# Patient Record
Sex: Male | Born: 1960 | Race: White | Hispanic: No | Marital: Married | Smoking: Never smoker
Health system: Southern US, Community
[De-identification: ages and names within clinical notes are randomized; demographics above are authoritative.]

## PROBLEM LIST (undated history)

## (undated) DIAGNOSIS — M109 Gout, unspecified: Secondary | ICD-10-CM

## (undated) DIAGNOSIS — I10 Essential (primary) hypertension: Secondary | ICD-10-CM

## (undated) DIAGNOSIS — N200 Calculus of kidney: Secondary | ICD-10-CM

## (undated) DIAGNOSIS — R000 Tachycardia, unspecified: Secondary | ICD-10-CM

## (undated) DIAGNOSIS — K635 Polyp of colon: Secondary | ICD-10-CM

## (undated) DIAGNOSIS — K7581 Nonalcoholic steatohepatitis (NASH): Secondary | ICD-10-CM

## (undated) HISTORY — PX: VASECTOMY: SHX75

## (undated) HISTORY — DX: Essential (primary) hypertension: I10

## (undated) HISTORY — DX: Polyp of colon: K63.5

## (undated) HISTORY — PX: CHOLECYSTECTOMY: SHX55

## (undated) HISTORY — DX: Nonalcoholic steatohepatitis (NASH): K75.81

## (undated) HISTORY — DX: Tachycardia, unspecified: R00.0

## (undated) HISTORY — PX: WISDOM TOOTH EXTRACTION: SHX21

## (undated) HISTORY — DX: Gout, unspecified: M10.9

## (undated) HISTORY — DX: Calculus of kidney: N20.0

## (undated) HISTORY — PX: REFRACTIVE SURGERY: SHX103

---

## 2012-09-04 DIAGNOSIS — Z87442 Personal history of urinary calculi: Secondary | ICD-10-CM | POA: Insufficient documentation

## 2012-09-04 DIAGNOSIS — K5730 Diverticulosis of large intestine without perforation or abscess without bleeding: Secondary | ICD-10-CM | POA: Insufficient documentation

## 2012-09-04 DIAGNOSIS — L719 Rosacea, unspecified: Secondary | ICD-10-CM | POA: Insufficient documentation

## 2012-09-04 DIAGNOSIS — I10 Essential (primary) hypertension: Secondary | ICD-10-CM | POA: Insufficient documentation

## 2012-09-04 DIAGNOSIS — Z8679 Personal history of other diseases of the circulatory system: Secondary | ICD-10-CM | POA: Insufficient documentation

## 2012-09-04 DIAGNOSIS — M109 Gout, unspecified: Secondary | ICD-10-CM | POA: Insufficient documentation

## 2014-06-22 DIAGNOSIS — N529 Male erectile dysfunction, unspecified: Secondary | ICD-10-CM | POA: Insufficient documentation

## 2015-01-28 DIAGNOSIS — K635 Polyp of colon: Secondary | ICD-10-CM | POA: Insufficient documentation

## 2015-09-05 DIAGNOSIS — R7989 Other specified abnormal findings of blood chemistry: Secondary | ICD-10-CM | POA: Insufficient documentation

## 2015-09-05 DIAGNOSIS — R945 Abnormal results of liver function studies: Secondary | ICD-10-CM

## 2015-11-18 DIAGNOSIS — I10 Essential (primary) hypertension: Secondary | ICD-10-CM | POA: Diagnosis not present

## 2015-11-18 DIAGNOSIS — K219 Gastro-esophageal reflux disease without esophagitis: Secondary | ICD-10-CM | POA: Diagnosis not present

## 2015-11-18 DIAGNOSIS — K8020 Calculus of gallbladder without cholecystitis without obstruction: Secondary | ICD-10-CM | POA: Diagnosis not present

## 2015-11-18 DIAGNOSIS — K824 Cholesterolosis of gallbladder: Secondary | ICD-10-CM | POA: Diagnosis not present

## 2015-11-18 DIAGNOSIS — K8010 Calculus of gallbladder with chronic cholecystitis without obstruction: Secondary | ICD-10-CM | POA: Diagnosis not present

## 2015-11-18 DIAGNOSIS — Z9889 Other specified postprocedural states: Secondary | ICD-10-CM | POA: Diagnosis not present

## 2015-11-18 DIAGNOSIS — Z7982 Long term (current) use of aspirin: Secondary | ICD-10-CM | POA: Diagnosis not present

## 2015-11-18 DIAGNOSIS — M109 Gout, unspecified: Secondary | ICD-10-CM | POA: Diagnosis not present

## 2015-11-18 DIAGNOSIS — K8080 Other cholelithiasis without obstruction: Secondary | ICD-10-CM | POA: Diagnosis not present

## 2015-12-08 DIAGNOSIS — Z9049 Acquired absence of other specified parts of digestive tract: Secondary | ICD-10-CM | POA: Insufficient documentation

## 2015-12-08 MED FILL — LOSARTAN POTASSIUM 100 MG T: 100 | 90 days supply | Qty: 90 | Fill #1

## 2015-12-08 MED FILL — MINOCYCLINE 100 MG CAPSULE: 100 | 30 days supply | Qty: 60 | Fill #1

## 2015-12-08 MED FILL — BYSTOLIC 20 MG TABLET: 20 | 90 days supply | Qty: 90 | Fill #1

## 2015-12-08 MED FILL — ALLOPURINOL 300 MG TABLET: 300 | 90 days supply | Qty: 90 | Fill #1

## 2015-12-13 DIAGNOSIS — I10 Essential (primary) hypertension: Secondary | ICD-10-CM | POA: Diagnosis not present

## 2015-12-13 DIAGNOSIS — Z9049 Acquired absence of other specified parts of digestive tract: Secondary | ICD-10-CM | POA: Diagnosis not present

## 2015-12-13 DIAGNOSIS — R7989 Other specified abnormal findings of blood chemistry: Secondary | ICD-10-CM | POA: Diagnosis not present

## 2015-12-13 DIAGNOSIS — K824 Cholesterolosis of gallbladder: Secondary | ICD-10-CM | POA: Diagnosis not present

## 2015-12-13 MED FILL — AMLODIPINE BESYLATE 5 MG TA: 5 | 90 days supply | Qty: 90 | Fill #0

## 2016-01-09 MED FILL — MINOCYCLINE 100 MG CAPSULE: 100 | 30 days supply | Qty: 60 | Fill #2

## 2016-02-10 MED FILL — MINOCYCLINE HCL 50 MG TAB: 50 | 30 days supply | Qty: 120 | Fill #0

## 2016-03-07 MED FILL — AMLODIPINE BESYLATE 5 MG TA: 5 | 90 days supply | Qty: 90 | Fill #1

## 2016-03-30 MED FILL — ALLOPURINOL 300 MG TABLET: 300 | 90 days supply | Qty: 90 | Fill #2

## 2016-04-06 DIAGNOSIS — R05 Cough: Secondary | ICD-10-CM | POA: Diagnosis not present

## 2016-04-06 DIAGNOSIS — R0982 Postnasal drip: Secondary | ICD-10-CM | POA: Diagnosis not present

## 2016-04-20 MED FILL — MINOCYCLINE 100 MG CAPSULE: 100 | 90 days supply | Qty: 180 | Fill #0

## 2016-04-20 MED FILL — LOSARTAN POTASSIUM 100 MG T: 100 | 90 days supply | Qty: 90 | Fill #2

## 2016-05-07 MED FILL — MONTELUKAST SOD 10 MG TAB: 10 | 30 days supply | Qty: 90 | Fill #0

## 2016-05-07 MED FILL — LEVOCETIRIZINE 5 MG TABLET: 5 | 90 days supply | Qty: 90 | Fill #0

## 2016-06-06 MED FILL — AMLODIPINE BESYLATE 5 MG TA: 5 | 90 days supply | Qty: 90 | Fill #0

## 2016-06-19 MED FILL — ALLOPURINOL 300 MG TABLET: 300 | 90 days supply | Qty: 90 | Fill #3

## 2016-07-23 MED FILL — LOSARTAN POTASSIUM 100 MG T: 100 | 90 days supply | Qty: 90 | Fill #3

## 2016-07-23 MED FILL — MINOCYCLINE 100 MG CAPSULE: 100 | 90 days supply | Qty: 180 | Fill #1

## 2016-08-02 MED FILL — MONTELUKAST SOD 10 MG TAB: 10 | 30 days supply | Qty: 90 | Fill #1

## 2016-08-30 MED FILL — AMLODIPINE BESYLATE 5 MG TA: 5 | 90 days supply | Qty: 90 | Fill #1

## 2016-09-28 MED FILL — ALLOPURINOL 300 MG TABLET: 300 | 90 days supply | Qty: 90 | Fill #0

## 2016-10-15 DIAGNOSIS — R221 Localized swelling, mass and lump, neck: Secondary | ICD-10-CM | POA: Diagnosis not present

## 2016-10-15 DIAGNOSIS — R591 Generalized enlarged lymph nodes: Secondary | ICD-10-CM | POA: Diagnosis not present

## 2016-10-15 MED FILL — LOSARTAN POTASSIUM 100 MG T: 100 | 90 days supply | Qty: 90 | Fill #0

## 2016-10-15 MED FILL — MINOCYCLINE 100 MG CAPSULE: 100 | 90 days supply | Qty: 180 | Fill #2

## 2016-10-16 DIAGNOSIS — R591 Generalized enlarged lymph nodes: Secondary | ICD-10-CM | POA: Diagnosis not present

## 2016-10-16 DIAGNOSIS — R221 Localized swelling, mass and lump, neck: Secondary | ICD-10-CM | POA: Diagnosis not present

## 2016-10-22 DIAGNOSIS — R221 Localized swelling, mass and lump, neck: Secondary | ICD-10-CM | POA: Diagnosis not present

## 2016-10-22 DIAGNOSIS — R591 Generalized enlarged lymph nodes: Secondary | ICD-10-CM | POA: Diagnosis not present

## 2016-10-30 MED FILL — MONTELUKAST SOD 10 MG TAB: 10 | 30 days supply | Qty: 90 | Fill #2

## 2016-11-28 MED FILL — AMLODIPINE BESYLATE 5 MG TA: 5 | 90 days supply | Qty: 90 | Fill #2

## 2016-11-29 MED FILL — BYSTOLIC 20 MG TABLET: 20 | 90 days supply | Qty: 90 | Fill #0

## 2017-01-01 MED FILL — ALLOPURINOL 300 MG TABLET: 300 | 90 days supply | Qty: 90 | Fill #1

## 2017-01-17 MED FILL — MINOCYCLINE 100 MG CAPSULE: 100 | 90 days supply | Qty: 180 | Fill #0

## 2017-01-17 MED FILL — LOSARTAN POTASSIUM 100 MG T: 100 | 90 days supply | Qty: 90 | Fill #1

## 2017-01-30 MED FILL — MONTELUKAST SOD 10 MG TAB: 10 | 90 days supply | Qty: 90 | Fill #3

## 2017-03-05 MED FILL — AMLODIPINE BESYLATE 5 MG TA: 5 | 90 days supply | Qty: 90 | Fill #3

## 2017-04-03 MED FILL — ALLOPURINOL 300 MG TABLET: 300 | 90 days supply | Qty: 90 | Fill #2

## 2017-04-22 MED FILL — MINOCYCLINE 100 MG CAPSULE: 100 | 90 days supply | Qty: 180 | Fill #1

## 2017-04-22 MED FILL — LOSARTAN POTASSIUM 100 MG T: 100 | 90 days supply | Qty: 90 | Fill #2

## 2017-05-27 MED FILL — BYSTOLIC 20 MG TABLET: 20 | 90 days supply | Qty: 90 | Fill #1

## 2017-05-27 MED FILL — AMLODIPINE BESYLATE 5 MG TA: 5 | 90 days supply | Qty: 90 | Fill #4

## 2017-07-01 MED FILL — ALLOPURINOL 300 MG TABLET: 300 | 90 days supply | Qty: 90 | Fill #3

## 2017-07-16 MED FILL — LOSARTAN POTASSIUM 100 MG T: 100 | 90 days supply | Qty: 90 | Fill #3

## 2017-07-16 MED FILL — MINOCYCLINE 100 MG CAPSULE: 100 | 90 days supply | Qty: 180 | Fill #2

## 2017-08-30 DIAGNOSIS — L918 Other hypertrophic disorders of the skin: Secondary | ICD-10-CM | POA: Diagnosis not present

## 2017-08-30 DIAGNOSIS — I10 Essential (primary) hypertension: Secondary | ICD-10-CM | POA: Diagnosis not present

## 2017-08-30 DIAGNOSIS — Z0000 Encounter for general adult medical examination without abnormal findings: Secondary | ICD-10-CM | POA: Diagnosis not present

## 2017-08-30 DIAGNOSIS — L989 Disorder of the skin and subcutaneous tissue, unspecified: Secondary | ICD-10-CM | POA: Diagnosis not present

## 2017-08-30 DIAGNOSIS — Z9189 Other specified personal risk factors, not elsewhere classified: Secondary | ICD-10-CM | POA: Diagnosis not present

## 2017-08-30 DIAGNOSIS — M1A9XX0 Chronic gout, unspecified, without tophus (tophi): Secondary | ICD-10-CM | POA: Diagnosis not present

## 2017-08-30 MED FILL — OLMESARTAN MEDOXOMIL 40 MG: 40 | 30 days supply | Qty: 30 | Fill #0

## 2017-09-02 MED FILL — AMLODIPINE BESYLATE 5 MG TA: 5 | 90 days supply | Qty: 90 | Fill #0

## 2017-10-01 DIAGNOSIS — R945 Abnormal results of liver function studies: Secondary | ICD-10-CM | POA: Diagnosis not present

## 2017-10-01 DIAGNOSIS — I10 Essential (primary) hypertension: Secondary | ICD-10-CM | POA: Diagnosis not present

## 2017-10-01 DIAGNOSIS — R0982 Postnasal drip: Secondary | ICD-10-CM | POA: Diagnosis not present

## 2017-10-01 DIAGNOSIS — Q828 Other specified congenital malformations of skin: Secondary | ICD-10-CM | POA: Diagnosis not present

## 2017-10-01 DIAGNOSIS — R208 Other disturbances of skin sensation: Secondary | ICD-10-CM | POA: Diagnosis not present

## 2017-10-01 MED FILL — ALLOPURINOL 300 MG TABLET: 300 | 90 days supply | Qty: 90 | Fill #0

## 2017-10-01 MED FILL — OLMESARTAN MEDOXOMIL 40 MG: 40 | 90 days supply | Qty: 90 | Fill #0

## 2017-10-01 MED FILL — MONTELUKAST SOD 10 MG TAB: 10 | 90 days supply | Qty: 90 | Fill #0

## 2017-10-01 MED FILL — LEVOCETIRIZINE 5 MG TABLET: 5 | 90 days supply | Qty: 90 | Fill #0

## 2017-10-10 DIAGNOSIS — K760 Fatty (change of) liver, not elsewhere classified: Secondary | ICD-10-CM | POA: Diagnosis not present

## 2017-10-25 MED FILL — MINOCYCLINE 100 MG CAPSULE: 100 | 90 days supply | Qty: 180 | Fill #3

## 2017-11-04 MED FILL — BYSTOLIC 20 MG TABLET: 20 | 90 days supply | Qty: 90 | Fill #2

## 2017-12-02 MED FILL — AMLODIPINE BESYLATE 5 MG TA: 5 | 90 days supply | Qty: 90 | Fill #1

## 2017-12-30 MED FILL — ALLOPURINOL 300 MG TABLET: 300 | 90 days supply | Qty: 90 | Fill #1

## 2017-12-30 MED FILL — OLMESARTAN MEDOXOMIL 40 MG: 40 | 90 days supply | Qty: 90 | Fill #1

## 2018-01-24 MED FILL — MINOCYCLINE 100 MG CAPSULE: 100 | 90 days supply | Qty: 180 | Fill #0

## 2018-02-27 MED FILL — AMLODIPINE BESYLATE 5 MG TA: 5 | 90 days supply | Qty: 90 | Fill #2

## 2018-03-31 MED FILL — ALLOPURINOL 300 MG TABLET: 300 | 90 days supply | Qty: 90 | Fill #2

## 2018-04-01 MED FILL — OLMESARTAN MEDOXOMIL 40 MG: 40 | 90 days supply | Qty: 90 | Fill #0

## 2018-05-21 MED FILL — MINOCYCLINE 100 MG CAPSULE: 100 | 90 days supply | Qty: 180 | Fill #1

## 2018-05-21 MED FILL — BYSTOLIC 20 MG TABLET: 20 | 90 days supply | Qty: 90 | Fill #0

## 2018-05-21 MED FILL — AMLODIPINE BESYLATE 5 MG TA: 5 | 90 days supply | Qty: 90 | Fill #3

## 2018-06-02 ENCOUNTER — Other Ambulatory Visit: Payer: Self-pay | Admitting: Physician Assistant

## 2018-06-02 DIAGNOSIS — N281 Cyst of kidney, acquired: Secondary | ICD-10-CM

## 2018-06-04 ENCOUNTER — Ambulatory Visit (INDEPENDENT_AMBULATORY_CARE_PROVIDER_SITE_OTHER): Payer: No Typology Code available for payment source

## 2018-06-04 DIAGNOSIS — N281 Cyst of kidney, acquired: Secondary | ICD-10-CM

## 2018-06-04 DIAGNOSIS — N200 Calculus of kidney: Secondary | ICD-10-CM

## 2018-06-09 ENCOUNTER — Ambulatory Visit (INDEPENDENT_AMBULATORY_CARE_PROVIDER_SITE_OTHER): Payer: No Typology Code available for payment source | Admitting: Osteopathic Medicine

## 2018-06-09 ENCOUNTER — Telehealth: Payer: Self-pay | Admitting: Osteopathic Medicine

## 2018-06-09 ENCOUNTER — Encounter: Payer: Self-pay | Admitting: Osteopathic Medicine

## 2018-06-09 VITALS — BP 135/86 | HR 58 | Temp 98.1°F | Ht 70.0 in | Wt 254.0 lb

## 2018-06-09 DIAGNOSIS — K7581 Nonalcoholic steatohepatitis (NASH): Secondary | ICD-10-CM

## 2018-06-09 DIAGNOSIS — J302 Other seasonal allergic rhinitis: Secondary | ICD-10-CM

## 2018-06-09 DIAGNOSIS — R7989 Other specified abnormal findings of blood chemistry: Secondary | ICD-10-CM

## 2018-06-09 DIAGNOSIS — R945 Abnormal results of liver function studies: Secondary | ICD-10-CM | POA: Diagnosis not present

## 2018-06-09 DIAGNOSIS — I10 Essential (primary) hypertension: Secondary | ICD-10-CM | POA: Diagnosis not present

## 2018-06-09 DIAGNOSIS — L719 Rosacea, unspecified: Secondary | ICD-10-CM

## 2018-06-09 DIAGNOSIS — Z87898 Personal history of other specified conditions: Secondary | ICD-10-CM

## 2018-06-09 DIAGNOSIS — K635 Polyp of colon: Secondary | ICD-10-CM

## 2018-06-09 DIAGNOSIS — N281 Cyst of kidney, acquired: Secondary | ICD-10-CM

## 2018-06-09 DIAGNOSIS — N529 Male erectile dysfunction, unspecified: Secondary | ICD-10-CM

## 2018-06-09 DIAGNOSIS — M109 Gout, unspecified: Secondary | ICD-10-CM

## 2018-06-09 NOTE — Telephone Encounter (Signed)
Added

## 2018-06-09 NOTE — Progress Notes (Signed)
HPI: Robert Hoffman is a 57 y.o. male who  has a past medical history of Colon polyps, Gout, Hypertension, Kidney stone, Steatohepatitis, non-alcoholic, and Tachycardia.  he presents to [facility] today, 06/09/18,  for chief complaint of: New to establish See individual headings  Pleasant new patient here to establish care.  Cardiac: Hypertension and history of tachycardia. Bystolic 20 mg tablets, taking half a tablet daily, olmesartan 40 mg daily, amlodipine 5 mg daily  Gout:  On allopurinol 300 mg daily.  Has been sometime since a gout flare.  Renal:  Concern for renal cysts.  See imaging results copied and pasted below.  Cysts were found incidentally as part of ultrasound for elevated liver enzymes.  Colon polyps: Last colonoscopy May 2016.  Patient was told to follow-up in 5 years.  Hepatic: History of nonalcoholic hepatitis.  Respiratory: On montelukast 10 mg daily seasonal, levo cetirizine 5 mg daily seasonal, budesonide 32 mcg nasal spray also seasonal use.   Erectile dysfunction: Sildenafil 100 mg as needed.  On Minocycline for rosacea.  Former use of chewing tobacco.  Status post vasectomy  Family history of prostate cancer.  PSA testing has been normal.    Abdominal ultrasound 10/10/2017, records available through [facility], see care everywhere: "1.  Moderate hepatic steatosis. 2.  Hypoechoic region along the left hepatic lobe is favored to represent focal fatty sparing. 3.  Minimally complex cyst in the interpolar region of the right kidney. Additional simple cyst in the upper pole. Right kidney: The right kidney measures 11.0 cm in length. Normal size, contour, and echogenicity. No hydronephrosis or perinephric fluid. Anechoic structure measuring 2.7 x 3.0 x 2.4 cm in the upper pole. Minimally complex 2.2 x 1.7 x 2.0 cm cyst with thin septation in the interpolar region. "  Ultrasound 05/2018: "Right Kidney: Length:  12.5 cm. Echogenicity within normal limits. No hydronephrosis visualized 3.5 cm simple cyst. 2.6 cm cyst with thin. septation consistent benign cyst.     Past medical, surgical, social and family history reviewed:  Patient Active Problem List   Diagnosis Date Noted  . History of tachycardia 06/10/2018  . Renal cyst 06/10/2018  . NASH (nonalcoholic steatohepatitis) 06/10/2018  . Seasonal allergies 06/10/2018  . S/P cholecystectomy 12/08/2015  . Elevated liver function tests 09/05/2015  . Colon polyp 01/28/2015  . Male erectile disorder 06/22/2014  . Rosacea 09/04/2012  . Diverticulosis of colon 09/04/2012  . Essential hypertension 09/04/2012  . Gout 09/04/2012  . History of nephrolithiasis 09/04/2012  . History of PAT (paroxysmal atrial tachycardia) 09/04/2012    Past Surgical History:  Procedure Laterality Date  . CHOLECYSTECTOMY    . REFRACTIVE SURGERY    . VASECTOMY    . WISDOM TOOTH EXTRACTION      Social History   Tobacco Use  . Smoking status: Never Smoker  . Smokeless tobacco: Former Engineer, water Use Topics  . Alcohol use: Not on file    History reviewed. No pertinent family history.   Current medication list and allergy/intolerance information reviewed:    Current Outpatient Medications  Medication Sig Dispense Refill  . allopurinol (ZYLOPRIM) 300 MG tablet Take 300 mg by mouth daily.  3  . amLODipine (NORVASC) 5 MG tablet Take 5 mg by mouth daily.  99  . budesonide (RHINOCORT AQUA) 32 MCG/ACT nasal spray Place into the nose.    Marland Kitchen BYSTOLIC 20 MG TABS Take 0.5 tablets by mouth daily.   3  . levocetirizine (XYZAL) 5  MG tablet Take by mouth.    . minocycline (MINOCIN,DYNACIN) 100 MG capsule TAKE 1 CAPSULE BY MOUTH 2 TIMES DAILY.    . montelukast (SINGULAIR) 10 MG tablet TAKE 1 TABLET BY MOUTH DAILY.    Marland Kitchen. olmesartan (BENICAR) 40 MG tablet Take by mouth.    . sildenafil (VIAGRA) 100 MG tablet 1/2 to 1 tab as directed as needed for erectile dysfunction      No current facility-administered medications for this visit.     Allergies  Allergen Reactions  . Ace Inhibitors Cough      Review of Systems:  Constitutional:  No  fever, no chills, No recent illness, No unintentional weight changes. No significant fatigue.   HEENT: No  headache, no vision change, no hearing change, No sore throat, No  sinus pressure  Cardiac: No  chest pain, No  pressure, No palpitations, No  Orthopnea  Respiratory:  No  shortness of breath. No  Cough  Gastrointestinal: No  abdominal pain, No  nausea, No  vomiting,  No  blood in stool, No  diarrhea, No  constipation   Musculoskeletal: No new myalgia/arthralgia  Skin: No  Rash, No other wounds/concerning lesions  Genitourinary: No  incontinence, No  abnormal genital bleeding, No abnormal genital discharge  Hem/Onc: No  easy bruising/bleeding, No  abnormal lymph node  Endocrine: No cold intolerance,  No heat intolerance. No polyuria/polydipsia/polyphagia   Neurologic: No  weakness, No  dizziness, No  slurred speech/focal weakness/facial droop  Psychiatric: No  concerns with depression, No  concerns with anxiety, No sleep problems, No mood problems  Exam:  BP 135/86 (BP Location: Left Arm, Patient Position: Sitting, Cuff Size: Large)   Pulse (!) 58   Temp 98.1 F (36.7 C) (Oral)   Ht 5\' 10"  (1.778 m)   Wt 254 lb (115.2 kg)   BMI 36.45 kg/m   Constitutional: VS see above. General Appearance: alert, well-developed, well-nourished, NAD  Eyes: Normal lids and conjunctive, non-icteric sclera  Ears, Nose, Mouth, Throat: MMM, Normal external inspection ears/nares/mouth/lips/gums. TM normal bilaterally. Pharynx/tonsils no erythema, no exudate. Nasal mucosa normal.   Neck: No masses, trachea midline. No thyroid enlargement. No tenderness/mass appreciated. No lymphadenopathy  Respiratory: Normal respiratory effort. no wheeze, no rhonchi, no rales  Cardiovascular: S1/S2 normal, no murmur, no rub/gallop  auscultated. RRR. No lower extremity edema.  Gastrointestinal: Nontender, no masses. No hepatomegaly, no splenomegaly. No hernia appreciated. Bowel sounds normal. Rectal exam deferred.   Musculoskeletal: Gait normal. No clubbing/cyanosis of digits.   Neurological: Normal balance/coordination. No tremor. No cranial nerve deficit on limited exam.  Skin: warm, dry, intact. No rash/ulcer.  Psychiatric: Normal judgment/insight. Normal mood and affect. Oriented x3.     ASSESSMENT/PLAN: The primary encounter diagnosis was Essential hypertension. Diagnoses of Polyp of colon, unspecified part of colon, unspecified type, Acne rosacea, Elevated liver function tests, Gout, unspecified cause, unspecified chronicity, unspecified site, Male erectile disorder, History of tachycardia, Renal cyst, NASH (nonalcoholic steatohepatitis), Seasonal allergies, and Rosacea were also pertinent to this visit.  Chronic medical conditions appear relatively stable.  Will go ahead and get baseline labs, I think probably reasonable to follow-up with renal ultrasound in another 6 months since this may have grown a bit, though measurements on July's ultrasound are a bit less precise.  Orders Placed This Encounter  Procedures  . CBC  . COMPLETE METABOLIC PANEL WITH GFR  . Lipid panel  . Uric acid      Visit summary with medication list and pertinent instructions was  printed for patient to review. All questions at time of visit were answered - patient instructed to contact office with any additional concerns. ER/RTC precautions were reviewed with the patient.   Follow-up plan: Return in about 6 months (around 12/10/2018) for [facility] PHYSICAL, sooner if needed.    Please note: voice recognition software was used to produce this document, and typos may escape review. Please contact Dr. Lyn Hollingshead for any needed clarifications.

## 2018-06-09 NOTE — Telephone Encounter (Signed)
-----   Message from Sunnie NielsenNatalie Alexander, DO sent at 06/09/2018 11:43 AM EDT ----- Regarding: shingrix  Add to shingrix list thanks

## 2018-06-10 ENCOUNTER — Encounter: Payer: Self-pay | Admitting: Osteopathic Medicine

## 2018-06-10 DIAGNOSIS — Z87898 Personal history of other specified conditions: Secondary | ICD-10-CM | POA: Insufficient documentation

## 2018-06-10 DIAGNOSIS — K7581 Nonalcoholic steatohepatitis (NASH): Secondary | ICD-10-CM | POA: Insufficient documentation

## 2018-06-10 DIAGNOSIS — J302 Other seasonal allergic rhinitis: Secondary | ICD-10-CM | POA: Insufficient documentation

## 2018-06-10 DIAGNOSIS — N281 Cyst of kidney, acquired: Secondary | ICD-10-CM | POA: Insufficient documentation

## 2018-06-11 LAB — COMPLETE METABOLIC PANEL WITH GFR
AG RATIO: 2 (calc) (ref 1.0–2.5)
ALT: 88 U/L — AB (ref 9–46)
AST: 57 U/L — AB (ref 10–35)
Albumin: 4.6 g/dL (ref 3.6–5.1)
Alkaline phosphatase (APISO): 49 U/L (ref 40–115)
BUN: 15 mg/dL (ref 7–25)
CALCIUM: 9.6 mg/dL (ref 8.6–10.3)
CO2: 31 mmol/L (ref 20–32)
CREATININE: 1.25 mg/dL (ref 0.70–1.33)
Chloride: 105 mmol/L (ref 98–110)
GFR, EST AFRICAN AMERICAN: 73 mL/min/{1.73_m2} (ref >=60)
GFR, Est Non African American: 63 mL/min/{1.73_m2} (ref >=60)
GLOBULIN: 2.3 g/dL (ref 1.9–3.7)
Glucose, Bld: 102 mg/dL — ABNORMAL HIGH (ref 65–99)
Potassium: 4.3 mmol/L (ref 3.5–5.3)
Sodium: 143 mmol/L (ref 135–146)
TOTAL PROTEIN: 6.9 g/dL (ref 6.1–8.1)
Total Bilirubin: 0.6 mg/dL (ref 0.2–1.2)

## 2018-06-11 LAB — CBC
HEMATOCRIT: 47.6 % (ref 38.5–50.0)
HEMOGLOBIN: 16 g/dL (ref 13.2–17.1)
MCH: 28 pg (ref 27.0–33.0)
MCHC: 33.6 g/dL (ref 32.0–36.0)
MCV: 83.4 fL (ref 80.0–100.0)
MPV: 10.6 fL (ref 7.5–12.5)
Platelets: 196 10*3/uL (ref 140–400)
RBC: 5.71 10*6/uL (ref 4.20–5.80)
RDW: 14.2 % (ref 11.0–15.0)
WBC: 8.2 10*3/uL (ref 3.8–10.8)

## 2018-06-11 LAB — LIPID PANEL
CHOL/HDL RATIO: 4.1 (calc) (ref <5.0)
Cholesterol: 179 mg/dL (ref <200)
HDL: 44 mg/dL (ref >40)
LDL Cholesterol (Calc): 106 mg/dL (calc) — ABNORMAL HIGH
Non-HDL Cholesterol (Calc): 135 mg/dL (calc) — ABNORMAL HIGH (ref <130)
Triglycerides: 176 mg/dL — ABNORMAL HIGH (ref <150)

## 2018-06-11 LAB — TEST AUTHORIZATION

## 2018-06-11 LAB — HEMOGLOBIN A1C W/OUT EAG: Hgb A1c MFr Bld: 5.8 % of total Hgb — ABNORMAL HIGH (ref <5.7)

## 2018-06-11 LAB — URIC ACID: URIC ACID, SERUM: 5.6 mg/dL (ref 4.0–8.0)

## 2018-06-25 MED FILL — ALLOPURINOL 300 MG TABS: 300 | 90 days supply | Qty: 90 | Fill #3

## 2018-06-25 MED FILL — OLMESARTAN MEDOXOMIL 40 MG: 40 | 90 days supply | Qty: 90 | Fill #1

## 2018-07-04 ENCOUNTER — Ambulatory Visit (INDEPENDENT_AMBULATORY_CARE_PROVIDER_SITE_OTHER): Payer: No Typology Code available for payment source | Admitting: Physician Assistant

## 2018-07-04 VITALS — BP 147/75 | HR 68 | Temp 98.2°F

## 2018-07-04 DIAGNOSIS — Z23 Encounter for immunization: Secondary | ICD-10-CM

## 2018-07-04 DIAGNOSIS — I10 Essential (primary) hypertension: Secondary | ICD-10-CM

## 2018-07-04 NOTE — Progress Notes (Signed)
Pt came into clinic today for first shingles vaccine. Tolerated injection in left deltoid well, no immediate complications. Advised to schedule second injection in 2 weeks.   Vitals:   07/04/18 1520 07/04/18 1532  BP: (!) 151/71 (!) 147/75  Pulse: 65 68  Temp: 98.2 F (36.8 C)      His BP was elevated, he does feel he is about to pass a kidney stone. He is compliant with his medications. Pt reports stones were viewed on recent imaging ordered by PCP. Did Provide him with a strainer to see if he can catch the stone. Went over BP with Provider in office, no changes at this time.  Advised to check his BP at home, and to schedule his annual with PCP. Verbalized understanding. Denied flu shot today, stating he only wants one vaccine at a time. HM updated.

## 2018-07-14 ENCOUNTER — Encounter: Payer: Self-pay | Admitting: Osteopathic Medicine

## 2018-07-14 ENCOUNTER — Ambulatory Visit (INDEPENDENT_AMBULATORY_CARE_PROVIDER_SITE_OTHER): Payer: No Typology Code available for payment source

## 2018-07-14 ENCOUNTER — Ambulatory Visit (INDEPENDENT_AMBULATORY_CARE_PROVIDER_SITE_OTHER): Payer: No Typology Code available for payment source | Admitting: Osteopathic Medicine

## 2018-07-14 DIAGNOSIS — N201 Calculus of ureter: Secondary | ICD-10-CM

## 2018-07-14 DIAGNOSIS — I10 Essential (primary) hypertension: Secondary | ICD-10-CM

## 2018-07-14 DIAGNOSIS — R109 Unspecified abdominal pain: Secondary | ICD-10-CM | POA: Diagnosis not present

## 2018-07-14 DIAGNOSIS — R3915 Urgency of urination: Secondary | ICD-10-CM

## 2018-07-14 DIAGNOSIS — Z87442 Personal history of urinary calculi: Secondary | ICD-10-CM | POA: Diagnosis not present

## 2018-07-14 LAB — COMPLETE METABOLIC PANEL WITH GFR
AG Ratio: 1.6 (calc) (ref 1.0–2.5)
ALT: 74 U/L — ABNORMAL HIGH (ref 9–46)
AST: 66 U/L — ABNORMAL HIGH (ref 10–35)
Albumin: 4.1 g/dL (ref 3.6–5.1)
Alkaline phosphatase (APISO): 57 U/L (ref 40–115)
BUN / CREAT RATIO: 13 (calc) (ref 6–22)
BUN: 28 mg/dL — AB (ref 7–25)
CALCIUM: 9.9 mg/dL (ref 8.6–10.3)
CHLORIDE: 105 mmol/L (ref 98–110)
CO2: 25 mmol/L (ref 20–32)
Creat: 2.09 mg/dL — ABNORMAL HIGH (ref 0.70–1.33)
GFR, Est African American: 39 mL/min/{1.73_m2} — ABNORMAL LOW (ref >=60)
GFR, Est Non African American: 34 mL/min/{1.73_m2} — ABNORMAL LOW (ref >=60)
GLOBULIN: 2.5 g/dL (ref 1.9–3.7)
GLUCOSE: 99 mg/dL (ref 65–99)
POTASSIUM: 4.3 mmol/L (ref 3.5–5.3)
Sodium: 140 mmol/L (ref 135–146)
TOTAL PROTEIN: 6.6 g/dL (ref 6.1–8.1)
Total Bilirubin: 0.5 mg/dL (ref 0.2–1.2)

## 2018-07-14 LAB — POCT URINALYSIS DIPSTICK
Bilirubin, UA: NEGATIVE
Blood, UA: NEGATIVE
Glucose, UA: NEGATIVE
Ketones, UA: NEGATIVE
NITRITE UA: NEGATIVE
PH UA: 5.5 (ref 5.0–8.0)
PROTEIN UA: NEGATIVE
Spec Grav, UA: >=1.03 — AB (ref 1.010–1.025)
UROBILINOGEN UA: 0.2 U/dL

## 2018-07-14 MED ORDER — TAMSULOSIN HCL 0.4 MG PO CAPS: 0.4000 mg | ORAL_CAPSULE | Freq: Every day | ORAL | 0 refills | Status: DC

## 2018-07-14 MED FILL — TAMSULOSIN HCL 0.4 MG CAP: 0.4 | 30 days supply | Qty: 30 | Fill #0

## 2018-07-14 NOTE — Progress Notes (Addendum)
HPI: Robert Hoffman is a 57 y.o. male who  has a past medical history of Colon polyps, Gout, Hypertension, Kidney stone, Steatohepatitis, non-alcoholic, and Tachycardia.  he presents to [facility] today, 07/14/18,  for chief complaint of:  Concern for kidney stone  Passed stone 05/05/18. On 07/02/18 (12 days ago) started having R sided flank pain, stabbing, intermittent pain, associated with urinary urgency. No hematuria, (+)mucusy sediment in the urine strainer. No fever/chills. No nausea.        Past medical history, surgical history, and family history reviewed.  Current medication list and allergy/intolerance information reviewed.   (See remainder of HPI, ROS, Phys Exam below)  Results for orders placed or performed in visit on 07/14/18 (from the past 24 hour(s))  POCT Urinalysis Dipstick     Status: Abnormal   Collection Time: 07/14/18 10:50 AM  Result Value Ref Range   Color, UA YELLOW    Clarity, UA CLEAR    Glucose, UA Negative Negative   Bilirubin, UA Negative    Ketones, UA Negative    Spec Grav, UA >=1.030 (A) 1.010 - 1.025   Blood, UA Negative    pH, UA 5.5 5.0 - 8.0   Protein, UA Negative Negative   Urobilinogen, UA 0.2 0.2 or 1.0 E.U./dL   Nitrite, UA Negative    Leukocytes, UA Small (1+) (A) Negative   Appearance     Odor      Ct Renal Stone Study  Result Date: 07/14/2018 CLINICAL DATA:  Right flank pain. EXAM: CT ABDOMEN AND PELVIS WITHOUT CONTRAST TECHNIQUE: Multidetector CT imaging of the abdomen and pelvis was performed following the standard protocol without IV contrast. COMPARISON:  Abdominal ultrasound 06/04/2018 FINDINGS: Lower chest: No acute abnormality. Hepatobiliary: No focal liver abnormality is seen. Status post cholecystectomy. No biliary dilatation. Pancreas: Unremarkable. No pancreatic ductal dilatation or surrounding inflammatory changes. Spleen: Normal in size without focal abnormality. Adrenals/Urinary Tract:  Normal adrenal glands. Bilateral nephrolithiasis, mostly with punctate 1-2 mm calcifications. The largest calculus in the lower pole of the left kidney measures 6 mm. Moderate right hydroureter and hydronephrosis caused by obstructive 6 mm calculus in the region of the right trigone. Benign-appearing right renal cysts with an exophytic cyst off of the inferior pole of the right kidney measuring 3.8 cm. Stomach/Bowel: Stomach is within normal limits. Appendix appears normal. No evidence of bowel wall thickening, distention, or inflammatory changes. Scattered colonic diverticulosis. No evidence of diverticulitis. Vascular/Lymphatic: Aortic atherosclerosis. No enlarged abdominal or pelvic lymph nodes. Reproductive: Prostate is unremarkable. Other: No abdominal wall hernia or abnormality. No abdominopelvic ascites. Musculoskeletal: Mild spondylosis of the thoracolumbar spine. Focally advanced posterior facet arthropathy at T9-T10 which causes focal narrowing of the spinal canal and bilateral osseous neural foramina narrowing. IMPRESSION: Right obstructive uropathy caused by 6 mm calculus located within the right trigone, likely within the intravesicular portion of the distal right ureter. Bilateral nephrolithiasis. Benign-appearing right renal cysts. Scattered colonic diverticulosis. Mild spondylosis of the thoracolumbar spine. Focally advanced posterior facet arthropathy at T9-T10 which causes focal narrowing of the spinal canal and bilateral osseous neural foramina narrowing. Electronically Signed   By: Ted Mcalpine M.D.   On: 07/14/2018 12:02        ASSESSMENT/PLAN: Diagnoses of Right flank pain, Urinary urgency, History of kidney stones, Essential hypertension, and Abdominal pain, unspecified abdominal location were pertinent to this visit.  Orders Placed This Encounter  Procedures  . Urine Culture  . CT RENAL STONE STUDY  .  Urinalysis, microscopic only  . COMPLETE METABOLIC PANEL WITH GFR  .  Ambulatory referral to Urology  . POCT Urinalysis Dipstick      Meds ordered this encounter  Medications  . tamsulosin (FLOMAX) 0.4 MG CAPS capsule    Sig: Take 1 capsule (0.4 mg total) by mouth daily. Up to 4 weeks to help passage of stone    Dispense:  30 capsule    Refill:  0    Patient Instructions  Plan: CT scan to evaluate for stone that might be stuck Trial Flomax to help pass a stone Await urine testing for infection  Referral to urology    Follow-up plan: Return if symptoms worsen or fail to improve.                                ############################################ ############################################ ############################################ ############################################    Outpatient Encounter Medications as of 07/14/2018  Medication Sig  . allopurinol (ZYLOPRIM) 300 MG tablet Take 300 mg by mouth daily.  Marland Kitchen amLODipine (NORVASC) 5 MG tablet Take 5 mg by mouth daily.  . budesonide (RHINOCORT AQUA) 32 MCG/ACT nasal spray Place into the nose.  Marland Kitchen BYSTOLIC 20 MG TABS Take 0.5 tablets by mouth daily.   Marland Kitchen levocetirizine (XYZAL) 5 MG tablet Take by mouth.  . minocycline (MINOCIN,DYNACIN) 100 MG capsule TAKE 1 CAPSULE BY MOUTH 2 TIMES DAILY.  . montelukast (SINGULAIR) 10 MG tablet TAKE 1 TABLET BY MOUTH DAILY.  Marland Kitchen olmesartan (BENICAR) 40 MG tablet Take by mouth.  . sildenafil (VIAGRA) 100 MG tablet 1/2 to 1 tab as directed as needed for erectile dysfunction   No facility-administered encounter medications on file as of 07/14/2018.    Allergies  Allergen Reactions  . Ace Inhibitors Cough      Review of Systems:  Constitutional: No recent illness  HEENT: No  headache, no vision change  Cardiac: No  chest pain, No  pressure, No palpitations  Respiratory:  No  shortness of breath. No  Cough  GU: see HPI  Musculoskeletal: No new myalgia/arthralgia  Neurologic: No  weakness, No   Dizziness  Psychiatric: No  concerns with depression, No  concerns with anxiety  Exam:  BP (!) 145/73 (BP Location: Left Arm, Patient Position: Sitting, Cuff Size: Large)   Pulse 63   Temp 97.6 F (36.4 C) (Oral)   Wt 250 lb 3.2 oz (113.5 kg)   BMI 35.90 kg/m   Constitutional: VS see above. General Appearance: alert, well-developed, well-nourished, NAD  Eyes: Normal lids and conjunctive, non-icteric sclera  Ears, Nose, Mouth, Throat: MMM, Normal external inspection ears/nares/mouth/lips/gums.  Neck: No masses, trachea midline.   Respiratory: Normal respiratory effort. no wheeze, no rhonchi, no rales  Cardiovascular: S1/S2 normal, no murmur, no rub/gallop auscultated. RRR.   Musculoskeletal: Gait normal. Symmetric and independent movement of all extremities. Neg Lloyd bilaterall  Neurological: Normal balance/coordination. No tremor.  Skin: warm, dry, intact.   Psychiatric: Normal judgment/insight. Normal mood and affect. Oriented x3.   Visit summary with medication list and pertinent instructions was printed for patient to review, advised to alert [place] if any changes needed. All questions at time of visit were answered - patient instructed to contact office with any additional concerns. ER/RTC precautions were reviewed with the patient and understanding verbalized.   Follow-up plan: Return if symptoms worsen or fail to improve.  Note: Total time spent 25 minutes, greater than 50% of the visit was spent face-to-face  counseling and coordinating care for the following: Diagnoses of Right flank pain, Urinary urgency, History of kidney stones, Essential hypertension, and Abdominal pain, unspecified abdominal location were pertinent to this visit.Marland Kitchen  Please note: voice recognition software was used to produce this document, and typos may escape review. Please contact Dr. Lyn Hollingshead for any needed clarifications.

## 2018-07-14 NOTE — Patient Instructions (Addendum)
Plan: CT scan to evaluate for stone that might be stuck Trial Flomax to help pass a stone   Await urine testing for infection  Referral to urology

## 2018-07-14 NOTE — Addendum Note (Signed)
Addended by: Deirdre Pippins on: 07/14/2018 12:22 PM   Modules accepted: Orders

## 2018-07-15 LAB — URINALYSIS, MICROSCOPIC ONLY
Bacteria, UA: NONE SEEN /HPF
Hyaline Cast: NONE SEEN /LPF
RBC / HPF: NONE SEEN /HPF (ref 0–2)
Squamous Epithelial / LPF: NONE SEEN /HPF (ref <=5)

## 2018-07-15 LAB — URINE CULTURE
MICRO NUMBER:: 91078027
RESULT: NO GROWTH
SPECIMEN QUALITY: ADEQUATE

## 2018-07-15 MED ORDER — CIPROFLOXACIN HCL 500 MG PO TABS: 500.0000 mg | ORAL_TABLET | Freq: Two times a day (BID) | ORAL | 0 refills | Status: DC

## 2018-07-15 MED FILL — CIPROFLOXACIN HCL 500 MG TA: 500 | 10 days supply | Qty: 20 | Fill #0

## 2018-07-15 NOTE — Addendum Note (Signed)
Addended by: Deirdre Pippins on: 07/15/2018 09:48 AM   Modules accepted: Orders

## 2018-07-18 ENCOUNTER — Other Ambulatory Visit: Payer: Self-pay | Admitting: Urology

## 2018-07-21 ENCOUNTER — Encounter: Payer: Self-pay | Admitting: Osteopathic Medicine

## 2018-07-31 ENCOUNTER — Encounter (HOSPITAL_COMMUNITY): Payer: Self-pay | Admitting: General Practice

## 2018-07-31 ENCOUNTER — Encounter (HOSPITAL_COMMUNITY)
Admission: RE | Disposition: A | Discharge status: Home / Self Care | Payer: Self-pay | Source: Other Acute Inpatient Hospital | Attending: Urology

## 2018-07-31 ENCOUNTER — Ambulatory Visit (HOSPITAL_COMMUNITY): Payer: No Typology Code available for payment source

## 2018-07-31 ENCOUNTER — Ambulatory Visit (HOSPITAL_COMMUNITY)
Admission: RE | Admit: 2018-07-31 | Discharge: 2018-07-31 | Disposition: A | Discharge status: Home / Self Care | Payer: No Typology Code available for payment source | Source: Other Acute Inpatient Hospital | Attending: Urology | Admitting: Urology

## 2018-07-31 DIAGNOSIS — Z79899 Other long term (current) drug therapy: Secondary | ICD-10-CM | POA: Insufficient documentation

## 2018-07-31 DIAGNOSIS — I10 Essential (primary) hypertension: Secondary | ICD-10-CM | POA: Diagnosis not present

## 2018-07-31 DIAGNOSIS — Z87442 Personal history of urinary calculi: Secondary | ICD-10-CM | POA: Diagnosis not present

## 2018-07-31 DIAGNOSIS — N202 Calculus of kidney with calculus of ureter: Secondary | ICD-10-CM | POA: Diagnosis present

## 2018-07-31 DIAGNOSIS — M109 Gout, unspecified: Secondary | ICD-10-CM | POA: Insufficient documentation

## 2018-07-31 DIAGNOSIS — R102 Pelvic and perineal pain: Secondary | ICD-10-CM | POA: Diagnosis not present

## 2018-07-31 DIAGNOSIS — N200 Calculus of kidney: Secondary | ICD-10-CM

## 2018-07-31 HISTORY — PX: EXTRACORPOREAL SHOCK WAVE LITHOTRIPSY: SHX1557

## 2018-07-31 SURGERY — LITHOTRIPSY, ESWL
Anesthesia: LOCAL | Laterality: Left

## 2018-07-31 MED ORDER — DIAZEPAM 5 MG PO TABS
10.0000 mg | ORAL_TABLET | ORAL | Status: AC
  Administered 2018-07-31: 10 mg via ORAL
  Filled 2018-07-31: qty 2

## 2018-07-31 MED ORDER — TRAMADOL HCL 50 MG PO TABS
ORAL_TABLET | ORAL | Status: AC
  Filled 2018-07-31: qty 2

## 2018-07-31 MED ORDER — TRAMADOL HCL 50 MG PO TABS
100.0000 mg | ORAL_TABLET | Freq: Once | ORAL | Status: AC
  Administered 2018-07-31: 100 mg via ORAL

## 2018-07-31 MED ORDER — CIPROFLOXACIN HCL 500 MG PO TABS
500.0000 mg | ORAL_TABLET | ORAL | Status: AC
  Administered 2018-07-31: 500 mg via ORAL
  Filled 2018-07-31: qty 1

## 2018-07-31 MED ORDER — TRAMADOL HCL 50 MG PO TABS: 50.0000 mg | ORAL_TABLET | Freq: Four times a day (QID) | ORAL | Status: DC | PRN

## 2018-07-31 MED ORDER — SODIUM CHLORIDE 0.9 % IV SOLN
INTRAVENOUS | Status: DC
  Administered 2018-07-31: 07:00:00 via INTRAVENOUS

## 2018-07-31 MED ORDER — DIPHENHYDRAMINE HCL 25 MG PO CAPS
25.0000 mg | ORAL_CAPSULE | ORAL | Status: AC
  Administered 2018-07-31: 25 mg via ORAL
  Filled 2018-07-31: qty 1

## 2018-07-31 MED FILL — traMADol HCL 50 MG TABS: 50 | 3 days supply | Qty: 20 | Fill #0

## 2018-07-31 NOTE — Discharge Instructions (Signed)
See [facility] discharge instructions in chart.  

## 2018-07-31 NOTE — H&P (Signed)
I have kidney stones.  HPI: Robert Hoffman is a 57 year-old male patient who was referred by Dr. Sunnie Nielsen, DO who is here for renal calculi.  The problem is on the right side. He first stated noticing pain on 07/14/2018. This is not his first kidney stone. He has had more than 5 stones prior to getting this one. He is currently having flank pain and back pain. He denies having groin pain, nausea, vomiting, fever, and chills. He has not caught a stone in his urine strainer since his symptoms began.   He has never had surgical treatment for calculi in the past.   The patient passed his stone given his urinalysis today. He has some residual discomfort in his groin. Yesterday, he went to the emergency department with severe right-sided pain. CT scan was performed which revealed a 6 mm ureterovesicular junction stone on the right. There was also a 6 mm nonobstructing calculus on the left kidney and some tiny nonobstructing calculi in the right kidney.     ALLERGIES: Ace Inhibitors    MEDICATIONS: Allopurinol  2 Blood Pressure Medications  Bystolic  Minocycline Hcl     GU PSH: Vasectomy    NON-GU PSH: Cholecystectomy (laparoscopic)    GU PMH: None   NON-GU PMH: Gout Hypertension    FAMILY HISTORY: Gout - Father, Grandmother Kidney Stones - Father, Brother Prostate Cancer - Father, Uncle   SOCIAL HISTORY: Marital Status: Married Preferred Language: English; Race: White Current Smoking Status: Patient has never smoked.   Tobacco Use Assessment Completed: Used Tobacco in last 30 days? Does not drink caffeine.    REVIEW OF SYSTEMS:    GU Review Male:   Patient reports frequent urination, hard to postpone urination, burning/ pain with urination, and get up at night to urinate. Patient denies leakage of urine, stream starts and stops, trouble starting your stream, have to strain to urinate , erection problems, and penile pain.  Gastrointestinal (Upper):   Patient denies  nausea, vomiting, and indigestion/ heartburn.  Gastrointestinal (Lower):   Patient denies diarrhea and constipation.  Constitutional:   Patient denies fever, night sweats, weight loss, and fatigue.  Skin:   Patient denies skin rash/ lesion and itching.  Eyes:   Patient denies blurred vision and double vision.  Ears/ Nose/ Throat:   Patient denies sore throat and sinus problems.  Hematologic/Lymphatic:   Patient denies swollen glands and easy bruising.  Cardiovascular:   Patient denies leg swelling and chest pains.  Respiratory:   Patient denies cough and shortness of breath.  Endocrine:   Patient denies excessive thirst.  Musculoskeletal:   Patient denies back pain and joint pain.  Neurological:   Patient denies headaches and dizziness.  Psychologic:   Patient denies depression and anxiety.   VITAL SIGNS:      07/15/2018 11:40 AM  Weight 250 lb / 113.4 kg  Height 70 in / 177.8 cm  BP 126/80 mmHg  Pulse 59 /min  Temperature 97.0 F / 36.1 C  BMI 35.9 kg/m   MULTI-SYSTEM PHYSICAL EXAMINATION:    Constitutional: Well-nourished. No physical deformities. Normally developed. Good grooming.  Respiratory: No labored breathing, no use of accessory muscles.   Cardiovascular: Normal temperature, adequate perfusion of extremities  Skin: No paleness, no jaundice, no cyanosis. No lesion, no ulcer, no rash.  Neurologic / Psychiatric: Oriented to time, oriented to place, oriented to person. No depression, no anxiety, no agitation.  Gastrointestinal: No mass, no tenderness, no rigidity, non obese abdomen.  Eyes: Normal conjunctivae. Normal eyelids.  Musculoskeletal: Normal gait and station of head and neck.     PAST DATA REVIEWED:  Source Of History:  Patient  Records Review:   Previous Patient Records  X-Ray Review: C.T. Abdomen/Pelvis: Reviewed Films. Reviewed Report. Discussed With Patient.     PROCEDURES:         KUB - [id number]  A single view of the abdomen is obtained.  6 mm  nonobstructing left renal calculus debris demonstrated. No ureteral calculi bilaterally               Urinalysis Dipstick Dipstick Cont'd  Color: Yellow Bilirubin: Neg mg/dL  Appearance: Clear Ketones: Neg mg/dL  Specific Gravity: 1.610 Blood: Neg ery/uL  pH: <=5.0 Protein: Neg mg/dL  Glucose: Neg mg/dL Urobilinogen: 0.2 mg/dL    Nitrites: Neg    Leukocyte Esterase: Neg leu/uL    ASSESSMENT:      ICD-10 Details  1 GU:   Renal and ureteral calculus - N20.2   2   Pelvic/perineal pain - R10.2    PLAN:           Orders Labs Stone Analysis  X-Rays: KUB          Schedule         Document Letter(s):  Created for Patient: Clinical Summary         Notes:   Patient passed the stone on the right. He has a small 6 mm left renal calculus remaining. He greatly desires intervention for this.   We discussed the management of urinary stones. These options include observation, ureteroscopy, and shockwave lithotripsy. We discussed which options are relevant to these particular stones. We discussed the natural of stones as well as the complications of untreated stones and the impact on quality of life without treatment as well as with each of the above listed treatments. We also discussed the efficacy of each treatment in its ability to clear the stone burden. With any of these management options I discussed the signs and symptoms of infection and the need for emergent treatment should these be experienced. For each option we discussed the ability of each procedure to clear the patient of their stone burden.   For observation I described the risks which include but are not limited to silent renal damage, life-threatening infection, need for emergent surgery, failure to pass stone, and pain.   For ureteroscopy I described the risks which include heart attack, stroke, pulmonary embolus, death, bleeding, infection, damage to contiguous structures, positioning injury, ureteral stricture, ureteral  avulsion, ureteral injury, need for ureteral stent, inability to perform ureteroscopy, need for an interval procedure, inability to clear stone burden, stent discomfort and pain.   For shockwave lithotripsy I described the risks which include arrhythmia, kidney contusion, kidney hemorrhage, need for transfusion, pain, inability to break up stone, inability to pass stone fragments, Steinstrasse, infection associated with obstructing stones, need for different surgical procedure, need for repeat shockwave lithotripsy.   He would like to proceed with left ESWL

## 2018-07-31 NOTE — Op Note (Signed)
See [facility] OP note scanned into chart. Also because of the size, density, location and other factors that cannot be anticipated I feel this will likely be a staged procedure. This fact supersedes any indication in the scanned [facility] operative note to the contrary.  

## 2018-08-01 ENCOUNTER — Encounter (HOSPITAL_COMMUNITY): Payer: Self-pay | Admitting: Urology

## 2018-08-19 MED FILL — POTASSIUM CITRATE ER 15 MEQ: 15 MEQ | 30 days supply | Qty: 60 | Fill #0

## 2018-08-25 MED FILL — AMLODIPINE BESYLATE 5 MG TA: 5 | 90 days supply | Qty: 90 | Fill #4

## 2018-09-03 ENCOUNTER — Ambulatory Visit: Payer: No Typology Code available for payment source

## 2018-09-03 ENCOUNTER — Ambulatory Visit (INDEPENDENT_AMBULATORY_CARE_PROVIDER_SITE_OTHER): Payer: No Typology Code available for payment source | Admitting: Osteopathic Medicine

## 2018-09-03 VITALS — Temp 97.8°F

## 2018-09-03 DIAGNOSIS — Z23 Encounter for immunization: Secondary | ICD-10-CM | POA: Diagnosis not present

## 2018-09-03 NOTE — Progress Notes (Signed)
Pt presents to office for second Shingrix. Pt does not report any side effects from first injection.  Pt tolerated injection well in L deltoid. No immediate complications.

## 2018-09-30 ENCOUNTER — Telehealth: Payer: Self-pay | Admitting: Osteopathic Medicine

## 2018-09-30 NOTE — Telephone Encounter (Signed)
Patient called and is in need of refills of his Allopurinol and Olmesartan sent to [facility]. Previously written by historical provider. Patient is not due for a follow up until 12/10/2018. Pls advise.

## 2018-10-01 MED ORDER — ALLOPURINOL 300 MG PO TABS: 300.0000 mg | ORAL_TABLET | Freq: Every day | ORAL | 3 refills | Status: DC

## 2018-10-01 MED ORDER — AMLODIPINE BESYLATE 5 MG PO TABS: 5.0000 mg | ORAL_TABLET | Freq: Every day | ORAL | 3 refills | Status: DC

## 2018-10-01 MED ORDER — OLMESARTAN MEDOXOMIL 40 MG PO TABS: 40.0000 mg | ORAL_TABLET | Freq: Every day | ORAL | 3 refills | Status: DC

## 2018-10-01 MED ORDER — MONTELUKAST SODIUM 10 MG PO TABS: 10.0000 mg | ORAL_TABLET | Freq: Every day | ORAL | 3 refills | Status: DC

## 2018-10-01 MED ORDER — BYSTOLIC 20 MG PO TABS: 0.5000 | ORAL_TABLET | Freq: Every day | ORAL | 3 refills | Status: DC

## 2018-10-01 MED FILL — OLMESARTAN MEDOXOMIL 40 MG: 40 | 90 days supply | Qty: 90 | Fill #0

## 2018-10-01 MED FILL — ALLOPURINOL 300 MG TABS: 300 | 90 days supply | Qty: 90 | Fill #0

## 2018-10-01 NOTE — Telephone Encounter (Signed)
Left message for patient that his prescriptions have been sent to the pharmacy. Patient was asked to call back with any questions.

## 2018-10-01 NOTE — Telephone Encounter (Signed)
Sent! I updated other medications in chart so pharmacy should be able to just contact us for refills

## 2018-10-30 MED FILL — MINOCYCLINE 100 MG CAPSULE: 100 | 90 days supply | Qty: 180 | Fill #2

## 2018-11-04 ENCOUNTER — Telehealth: Payer: No Typology Code available for payment source | Admitting: Physician Assistant

## 2018-11-04 DIAGNOSIS — J00 Acute nasopharyngitis [common cold]: Secondary | ICD-10-CM | POA: Diagnosis not present

## 2018-11-04 MED ORDER — IPRATROPIUM BROMIDE 0.06 % NA SOLN: 2.0000 | Freq: Four times a day (QID) | NASAL | 0 refills | Status: DC

## 2018-11-04 MED ORDER — BENZONATATE 100 MG PO CAPS: 100.0000 mg | ORAL_CAPSULE | Freq: Three times a day (TID) | ORAL | 0 refills | Status: DC

## 2018-11-04 MED FILL — IPRATROPIUM 0.06% SPRAY: 0.06 | 20 days supply | Qty: 15 | Fill #0

## 2018-11-04 MED FILL — BENZONATATE 100 MG CAPS: 100 | 5 days supply | Qty: 30 | Fill #0

## 2018-11-04 NOTE — Progress Notes (Signed)
We are sorry you are not feeling well.  Here is how we plan to help!  Based on what you have shared with me, it looks like you may have a viral upper respiratory infection or a "common cold".  Colds are caused by a large number of viruses; however, rhinovirus is the most common cause.   Symptoms of the common cold vary from person to person, with common symptoms including sore throat, cough, and malaise.  A low-grade fever of 100.4 may present, but is often uncommon.  Symptoms vary however, and are closely related to a person's age or underlying illnesses.  The most common symptoms associated with the common cold are nasal discharge or congestion, cough, sneezing, headache and pressure in the ears and face.  Cold symptoms usually persist for about 3 to 10 days, but can last up to 2 weeks.  It is important to know that colds do not cause serious illness or complications in most cases.    The common cold is transmitted from person to person, with the most common method of transmission being a person's hands.  The virus is able to live on the skin and can infect other persons for up to 2 hours after direct contact.  Also, colds are transmitted when someone coughs or sneezes; thus, it is important to cover the mouth to reduce this risk.  To keep the spread of the common cold at [place], good hand hygiene is very important.  This is an infection that is most likely caused by a virus. There are no specific treatments for the common cold other than to help you with the symptoms until the infection runs its course.    For nasal congestion, you may use an oral decongestants such as Mucinex D or if you have glaucoma or high blood pressure use plain Mucinex.  Saline nasal spray or nasal drops can help and can safely be used as often as needed for congestion.  For your congestion, I have prescribed Ipratropium Bromide nasal spray 0.03% two sprays in each nostril 2-3 times a day  If you do not have a history of heart  disease, hypertension, diabetes or thyroid disease, prostate/bladder issues or glaucoma, you may also use Sudafed to treat nasal congestion.  It is highly recommended that you consult with a pharmacist or your primary care physician to ensure this medication is safe for you to take.     If you have a cough, you may use cough suppressants such as Delsym and Robitussin.  If you have glaucoma or high blood pressure, you can also use Coricidin HBP.   For cough I have prescribed for you A prescription cough medication called Tessalon Perles 100 mg. You may take 1-2 capsules every 8 hours as needed for cough  If you have a sore or scratchy throat, use a saltwater gargle-  to  teaspoon of salt dissolved in a 4-ounce to 8-ounce glass of warm water.  Gargle the solution for approximately 15-30 seconds and then spit.  It is important not to swallow the solution.  You can also use throat lozenges/cough drops and Chloraseptic spray to help with throat pain or discomfort.  Warm or cold liquids can also be helpful in relieving throat pain.  For headache, pain or general discomfort, you can use Ibuprofen or Tylenol as directed.   Some authorities believe that zinc sprays or the use of Echinacea may shorten the course of your symptoms.   HOME CARE Only take medications as instructed  by your medical team. Be sure to drink plenty of fluids. Water is fine as well as fruit juices, sodas and electrolyte beverages. You may want to stay away from caffeine or alcohol. If you are nauseated, try taking small sips of liquids. How do you know if you are getting enough fluid? Your urine should be a pale yellow or almost colorless. Get rest. Taking a steamy shower or using a humidifier may help nasal congestion and ease sore throat pain. You can place a towel over your head and breathe in the steam from hot water coming from a faucet. Using a saline nasal spray works much the same way. Cough drops, hard candies and sore throat  lozenges may ease your cough. Avoid close contacts especially the very young and the elderly Cover your mouth if you cough or sneeze Always remember to wash your hands.   GET HELP RIGHT AWAY IF: You develop worsening fever. If your symptoms do not improve within 10 days You develop yellow or green discharge from your nose over 3 days. You have coughing fits You develop a severe head ache or visual changes. You develop shortness of breath, difficulty breathing or start having chest pain  Your symptoms persist after you have completed your treatment plan  MAKE SURE YOU  Understand these instructions. Will watch your condition. Will get help right away if you are not doing well or get worse.  Your e-visit answers were reviewed by a board certified advanced clinical practitioner to complete your personal care plan. Depending upon the condition, your plan could have included both over the counter or prescription medications. Please review your pharmacy choice. If there is a problem, you may call our nursing hot line at and have the prescription routed to another pharmacy. Your safety is important to us. If you have drug allergies check your prescription carefully.   You can use MyChart to ask questions about today's visit, request a non-urgent call back, or ask for a work or school excuse for 24 hours related to this e-Visit. If it has been greater than 24 hours you will need to follow up with your provider, or enter a new e-Visit to address those concerns. You will get an e-mail in the next two days asking about your experience.  I hope that your e-visit has been valuable and will speed your recovery. Thank you for using e-visits.      ===View-only below this line===   ----- Message -----    From: Margit BandaJohn M Napolitano    Sent: 11/04/2018 10:19 AM EST      To: E-Visit Mailing List Subject: E-Visit Submission: Sinus Problems  E-Visit Submission: Sinus  Problems --------------------------------  Question: Which of the following have you been experiencing? Answer:   Congested nose            Pain around the nose and face            Headache            Ear pain            Throat pain            Cough  Question: Have these symptoms significantly worsened over the last two to three days? Answer:   Yes  Question: Describe your sore throat: Answer:   raw feeling at top of throat coming out of sinus drainage area.  plus general soreness from contiuous cough  Question: How long have you had a sore throat? Answer:  3 days  Question: Do you have any tenderness or swelling in your neck? Answer:   No  Question: Have you had any of the following? Answer:   None of the above  Question: How long have you been having these symptoms? Answer:   4 days  Question: Do you have a fever? Answer:   I do not know  Question: Do you smoke? Answer:   No  Question: Have you ever smoked? Answer:   I have never smoked  Question: Do you have any chronic illnesses, such as diabetes, heart disease, kidney disease, or lung disease, or any illness that would weaken your body's ability to fight infection? Answer:   No  Question: When you blow your nose, what color is the mucus? Answer:   Mostly thick and yellow or green  Question: Have you experienced similar problems in the past? Answer:   Yes  Question: What treatments have worked in the past?  Answer:   don't remember the med for the infection, but after that...singular, xyzal, flonase  Question: What treatment(s) in the past have been unsuccessful? Answer:   none  Question: Is this illness similar to previous illnesses you have had?  How is it the same?  How is it different? Answer:   similar. previous sinus infection history.  also seasonal allergies.  with this one... occasionally feel feverish, but not often.  mostly green/yellow thick mucus discharge from sinuses, down throat, and out  through mouth.  have had a couple dark brown when clearing first thing in the morning.  sinus pressure/slight pain mostly lower left. some ear pressure on left side. persistent cough that is really aggravating with sinus drainage.  sore throat from cough.   using sinus rinse at least once per day, some days twice.  Question: Have you recently been hospitalized? Answer:   No  Question: What medications are you currently taking for these symptoms? Answer:   Nose spray  Question: Please enter the names of any medications you are taking, or any other treatments you are trying. Answer:   for this?  xyzal, singular, fluticasone prop spray, coricidin cough/cold w/ antihistamine.  NO decongestants due to blood pressure concerns.  saline wash.  Question: Please list your medication allergies that you may have ? (If 'none' , please list as 'none') Answer:   cannot remember, but it is in my record.  Question: Please list any additional comments  Answer:     We are sorry you are not feeling well.  Here is how we plan to help!  Based on what you have shared with me, it looks like you may have a viral upper respiratory infection or a "common cold".  Colds are caused by a large number of viruses; however, rhinovirus is the most common cause.   Symptoms of the common cold vary from person to person, with common symptoms including sore throat, cough, and malaise.  A low-grade fever of 100.4 may present, but is often uncommon.  Symptoms vary however, and are closely related to a person's age or underlying illnesses.  The most common symptoms associated with the common cold are nasal discharge or congestion, cough, sneezing, headache and pressure in the ears and face.  Cold symptoms usually persist for about 3 to 10 days, but can last up to 2 weeks.  It is important to know that colds do not cause serious illness or complications in most cases.    The common cold is transmitted from person to person,  with the  most common method of transmission being a person's hands.  The virus is able to live on the skin and can infect other persons for up to 2 hours after direct contact.  Also, colds are transmitted when someone coughs or sneezes; thus, it is important to cover the mouth to reduce this risk.  To keep the spread of the common cold at bay, good hand hygiene is very important.  This is an infection that is most likely caused by a virus. There are no specific treatments for the common cold other than to help you with the symptoms until the infection runs its course.    For nasal congestion, you may use an oral decongestants such as Mucinex D or if you have glaucoma or high blood pressure use plain Mucinex.  Saline nasal spray or nasal drops can help and can safely be used as often as needed for congestion.  For your congestion, I have prescribed Ipratropium Bromide nasal spray 0.03% two sprays in each nostril 2-3 times a day  If you do not have a history of heart disease, hypertension, diabetes or thyroid disease, prostate/bladder issues or glaucoma, you may also use Sudafed to treat nasal congestion.  It is highly recommended that you consult with a pharmacist or your primary care physician to ensure this medication is safe for you to take.     If you have a cough, you may use cough suppressants such as Delsym and Robitussin.  If you have glaucoma or high blood pressure, you can also use Coricidin HBP.   For cough I have prescribed for you A prescription cough medication called Tessalon Perles 100 mg. You may take 1-2 capsules every 8 hours as needed for cough  If you have a sore or scratchy throat, use a saltwater gargle-  to  teaspoon of salt dissolved in a 4-ounce to 8-ounce glass of warm water.  Gargle the solution for approximately 15-30 seconds and then spit.  It is important not to swallow the solution.  You can also use throat lozenges/cough drops and Chloraseptic spray to help with throat pain or  discomfort.  Warm or cold liquids can also be helpful in relieving throat pain.  For headache, pain or general discomfort, you can use Ibuprofen or Tylenol as directed.   Some authorities believe that zinc sprays or the use of Echinacea may shorten the course of your symptoms.   HOME CARE Only take medications as instructed by your medical team. Be sure to drink plenty of fluids. Water is fine as well as fruit juices, sodas and electrolyte beverages. You may want to stay away from caffeine or alcohol. If you are nauseated, try taking small sips of liquids. How do you know if you are getting enough fluid? Your urine should be a pale yellow or almost colorless. Get rest. Taking a steamy shower or using a humidifier may help nasal congestion and ease sore throat pain. You can place a towel over your head and breathe in the steam from hot water coming from a faucet. Using a saline nasal spray works much the same way. Cough drops, hard candies and sore throat lozenges may ease your cough. Avoid close contacts especially the very young and the elderly Cover your mouth if you cough or sneeze Always remember to wash your hands.   GET HELP RIGHT AWAY IF: You develop worsening fever. If your symptoms do not improve within 10 days You develop yellow or green discharge from your nose  over 3 days. You have coughing fits You develop a severe head ache or visual changes. You develop shortness of breath, difficulty breathing or start having chest pain  Your symptoms persist after you have completed your treatment plan  MAKE SURE YOU  Understand these instructions. Will watch your condition. Will get help right away if you are not doing well or get worse.  Your e-visit answers were reviewed by a board certified advanced clinical practitioner to complete your personal care plan. Depending upon the condition, your plan could have included both over the counter or prescription medications. Please review  your pharmacy choice. If there is a problem, you may call our nursing hot line at and have the prescription routed to another pharmacy. Your safety is important to [place]. If you have drug allergies check your prescription carefully.   You can use MyChart to ask questions about today's visit, request a non-urgent call back, or ask for a work or school excuse for 24 hours related to this e-Visit. If it has been greater than 24 hours you will need to follow up with your provider, or enter a new e-Visit to address those concerns. You will get an e-mail in the next two days asking about your experience.  I hope that your e-visit has been valuable and will speed your recovery. Thank you for using e-visits.      ===View-only below this line===   ----- Message -----    From: Margit Banda [place]    Sent: 11/04/2018 10:19 AM EST      To: E-Visit Mailing List Subject: E-Visit Submission: Sinus Problems  E-Visit Submission: Sinus Problems --------------------------------  Question: Which of the following have you been experiencing? Answer:   Congested nose            Pain around the nose and face            Headache            Ear pain            Throat pain            Cough  Question: Have these symptoms significantly worsened over the last two to three days? Answer:   Yes  Question: Describe your sore throat: Answer:   raw feeling at top of throat coming out of sinus drainage area.  plus general soreness from contiuous cough  Question: How long have you had a sore throat? Answer:   3 days  Question: Do you have any tenderness or swelling in your neck? Answer:   No  Question: Have you had any of the following? Answer:   None of the above  Question: How long have you been having these symptoms? Answer:   4 days  Question: Do you have a fever? Answer:   I do not know  Question: Do you smoke? Answer:   No  Question: Have you ever smoked? Answer:   I have never smoked  Question:  Do you have any chronic illnesses, such as diabetes, heart disease, kidney disease, or lung disease, or any illness that would weaken your body's ability to fight infection? Answer:   No  Question: When you blow your nose, what color is the mucus? Answer:   Mostly thick and yellow or green  Question: Have you experienced similar problems in the past? Answer:   Yes  Question: What treatments have worked in the past?  Answer:   don't remember the med for the infection, but after  that...singular, xyzal, flonase  Question: What treatment(s) in the past have been unsuccessful? Answer:   none  Question: Is this illness similar to previous illnesses you have had?  How is it the same?  How is it different? Answer:   similar. previous sinus infection history.  also seasonal allergies.  with this one... occasionally feel feverish, but not often.  mostly green/yellow thick mucus discharge from sinuses, down throat, and out through mouth.  have had a couple dark brown when clearing first thing in the morning.  sinus pressure/slight pain mostly lower left. some ear pressure on left side. persistent cough that is really aggravating with sinus drainage.  sore throat from cough.   using sinus rinse at least once per day, some days twice.  Question: Have you recently been hospitalized? Answer:   No  Question: What medications are you currently taking for these symptoms? Answer:   Nose spray  Question: Please enter the names of any medications you are taking, or any other treatments you are trying. Answer:   for this?  xyzal, singular, fluticasone prop spray, coricidin cough/cold w/ antihistamine.  NO decongestants due to blood pressure concerns.  saline wash.  Question: Please list your medication allergies that you may have ? (If 'none' , please list as 'none') Answer:   cannot remember, but it is in my record.  Question: Please list any additional comments  Answer:     We are sorry you are not  feeling well.  Here is how we plan to help!  Based on what you have shared with me, it looks like you may have a viral upper respiratory infection or a "common cold".  Colds are caused by a large number of viruses; however, rhinovirus is the most common cause.   Symptoms of the common cold vary from person to person, with common symptoms including sore throat, cough, and malaise.  A low-grade fever of 100.4 may present, but is often uncommon.  Symptoms vary however, and are closely related to a person's age or underlying illnesses.  The most common symptoms associated with the common cold are nasal discharge or congestion, cough, sneezing, headache and pressure in the ears and face.  Cold symptoms usually persist for about 3 to 10 days, but can last up to 2 weeks.  It is important to know that colds do not cause serious illness or complications in most cases.    The common cold is transmitted from person to person, with the most common method of transmission being a person's hands.  The virus is able to live on the skin and can infect other persons for up to 2 hours after direct contact.  Also, colds are transmitted when someone coughs or sneezes; thus, it is important to cover the mouth to reduce this risk.  To keep the spread of the common cold at bay, good hand hygiene is very important.  This is an infection that is most likely caused by a virus. There are no specific treatments for the common cold other than to help you with the symptoms until the infection runs its course.    For nasal congestion, you may use an oral decongestants such as Mucinex D or if you have glaucoma or high blood pressure use plain Mucinex.  Saline nasal spray or nasal drops can help and can safely be used as often as needed for congestion.  For your congestion, I have prescribed Ipratropium Bromide nasal spray 0.03% two sprays in each nostril 2-3 times  a day  If you do not have a history of heart disease, hypertension,  diabetes or thyroid disease, prostate/bladder issues or glaucoma, you may also use Sudafed to treat nasal congestion.  It is highly recommended that you consult with a pharmacist or your primary care physician to ensure this medication is safe for you to take.     If you have a cough, you may use cough suppressants such as Delsym and Robitussin.  If you have glaucoma or high blood pressure, you can also use Coricidin HBP.   For cough I have prescribed for you A prescription cough medication called Tessalon Perles 100 mg. You may take 1-2 capsules every 8 hours as needed for cough  If you have a sore or scratchy throat, use a saltwater gargle-  to  teaspoon of salt dissolved in a 4-ounce to 8-ounce glass of warm water.  Gargle the solution for approximately 15-30 seconds and then spit.  It is important not to swallow the solution.  You can also use throat lozenges/cough drops and Chloraseptic spray to help with throat pain or discomfort.  Warm or cold liquids can also be helpful in relieving throat pain.  For headache, pain or general discomfort, you can use Ibuprofen or Tylenol as directed.   Some authorities believe that zinc sprays or the use of Echinacea may shorten the course of your symptoms.   HOME CARE . Only take medications as instructed by your medical team. . Be sure to drink plenty of fluids. Water is fine as well as fruit juices, sodas and electrolyte beverages. You may want to stay away from caffeine or alcohol. If you are nauseated, try taking small sips of liquids. How do you know if you are getting enough fluid? Your urine should be a pale yellow or almost colorless. . Get rest. . Taking a steamy shower or using a humidifier may help nasal congestion and ease sore throat pain. You can place a towel over your head and breathe in the steam from hot water coming from a faucet. . Using a saline nasal spray works much the same way. . Cough drops, hard candies and sore throat lozenges  may ease your cough. . Avoid close contacts especially the very young and the elderly . Cover your mouth if you cough or sneeze . Always remember to wash your hands.   GET HELP RIGHT AWAY IF: . You develop worsening fever. . If your symptoms do not improve within 10 days . You develop yellow or green discharge from your nose over 3 days. . You have coughing fits . You develop a severe head ache or visual changes. . You develop shortness of breath, difficulty breathing or start having chest pain . Your symptoms persist after you have completed your treatment plan  MAKE SURE YOU   Understand these instructions.  Will watch your condition.  Will get help right away if you are not doing well or get worse.  Your e-visit answers were reviewed by a board certified advanced clinical practitioner to complete your personal care plan. Depending upon the condition, your plan could have included both over the counter or prescription medications. Please review your pharmacy choice. If there is a problem, you may call our nursing hot line at and have the prescription routed to another pharmacy. Your safety is important to [place]. If you have drug allergies check your prescription carefully.   You can use MyChart to ask questions about today's visit, request a non-urgent call back,  or ask for a work or school excuse for 24 hours related to this e-Visit. If it has been greater than 24 hours you will need to follow up with your provider, or enter a new e-Visit to address those concerns. You will get an e-mail in the next two days asking about your experience.  I hope that your e-visit has been valuable and will speed your recovery. Thank you for using e-visits.

## 2018-11-18 ENCOUNTER — Encounter: Payer: Self-pay | Admitting: Osteopathic Medicine

## 2018-11-18 MED ORDER — AMLODIPINE BESYLATE 5 MG PO TABS: 5.0000 mg | ORAL_TABLET | Freq: Every day | ORAL | 3 refills | Status: DC

## 2018-11-18 MED FILL — AMLODIPINE BESYLATE 5 MG TA: 5 | 90 days supply | Qty: 90 | Fill #0

## 2018-11-18 MED FILL — BYSTOLIC 20 MG TABLET: 20 | 90 days supply | Qty: 90 | Fill #1

## 2018-12-29 MED FILL — OLMESARTAN MEDOXOMIL 40 MG: 40 | 90 days supply | Qty: 90 | Fill #1 | Status: TO

## 2018-12-29 MED FILL — ALLOPURINOL 300 MG TABS: 300 | 90 days supply | Qty: 90 | Fill #1 | Status: TO

## 2018-12-29 MED FILL — POTASSIUM CITRATE ER 15 MEQ: 15 MEQ | 30 days supply | Qty: 60 | Fill #1 | Status: TO

## 2019-02-11 MED FILL — POTASSIUM CITRATE ER 15 MEQ: 15 MEQ | 30 days supply | Qty: 60 | Fill #0

## 2019-02-11 MED FILL — BYSTOLIC 20 MG TABLET: 20 | 90 days supply | Qty: 90 | Fill #0

## 2019-02-11 MED FILL — AMLODIPINE BESYLATE 5 MG TA: 5 | 90 days supply | Qty: 90 | Fill #0

## 2019-03-23 MED FILL — OLMESARTAN MEDOXOMIL 40 MG: 40 | 90 days supply | Qty: 90 | Fill #0

## 2019-03-23 MED FILL — ALLOPURINOL 300 MG TAB: 300 | 90 days supply | Qty: 90 | Fill #0

## 2019-04-27 MED FILL — POTASSIUM CITRATE ER 15 MEQ: 15 MEQ | 30 days supply | Qty: 60 | Fill #1

## 2019-04-28 ENCOUNTER — Telehealth: Payer: Self-pay

## 2019-04-28 NOTE — Telephone Encounter (Signed)
[facility] requesting med refills for minocycline. Written by historical provider.

## 2019-04-29 MED ORDER — MINOCYCLINE HCL 100 MG PO CAPS: 100.0000 mg | ORAL_CAPSULE | Freq: Two times a day (BID) | ORAL | 1 refills | Status: DC

## 2019-04-29 MED FILL — MINOCYCLINE HCL 100 MG CAPS: 100 | 90 days supply | Qty: 180 | Fill #0

## 2019-04-29 NOTE — Addendum Note (Signed)
Addended by: Maryla Morrow on: 04/29/2019 09:45 AM   Modules accepted: Orders

## 2019-05-26 MED FILL — AMLODIPINE BESYLATE 5 MG TA: 5 | 90 days supply | Qty: 90 | Fill #1

## 2019-06-26 MED FILL — POTASSIUM CITRATE ER 15 MEQ: 15 MEQ | 30 days supply | Qty: 60 | Fill #2

## 2019-06-26 MED FILL — OLMESARTAN MEDOXOMIL 40 MG: 40 | 30 days supply | Qty: 30 | Fill #1

## 2019-06-26 MED FILL — ALLOPURINOL 300 MG TAB: 300 | 90 days supply | Qty: 90 | Fill #1

## 2019-07-14 IMAGING — US US RENAL
1 series · 14 of 25 positions shown · non-contrast
Comparison: No prior.

CLINICAL DATA: Renal cyst.

EXAM:
RENAL / URINARY TRACT ULTRASOUND COMPLETE

[Series 1: us renal · 0.22mm/px · 14 of 42 slices shown]
[im 1/42]
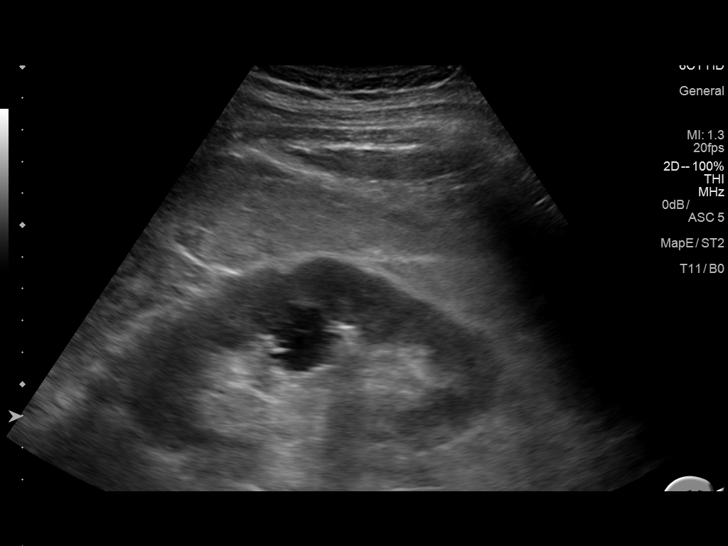
[im 4/42]
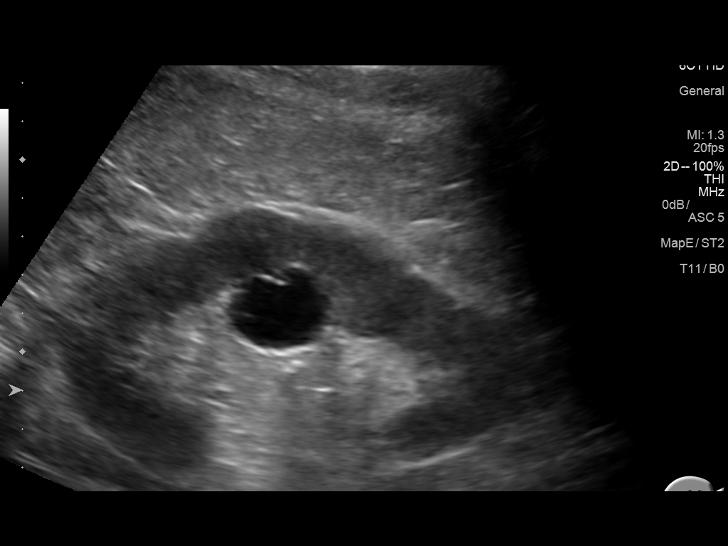
[im 7/42]
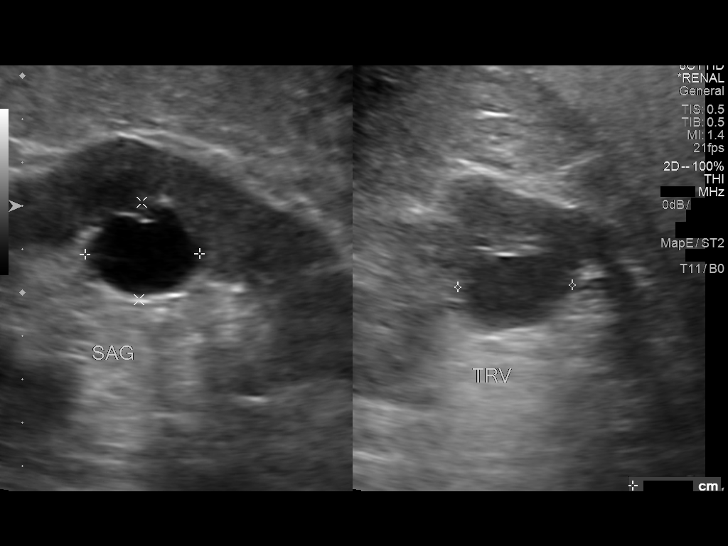
[im 11/42]
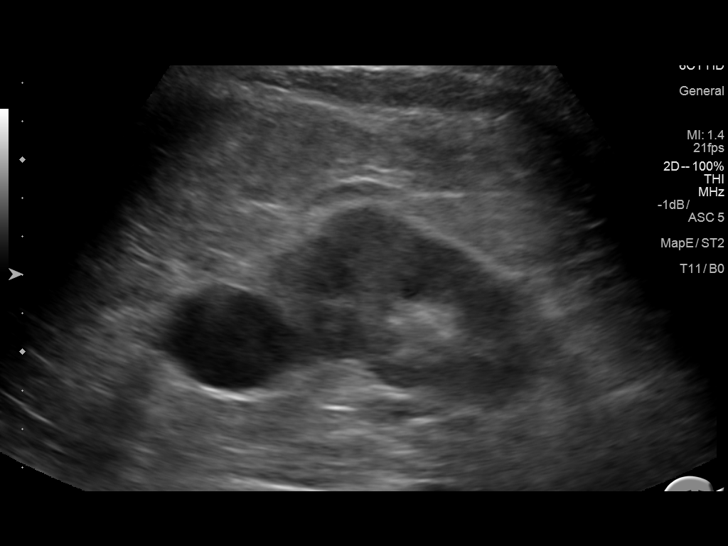
[im 14/42]
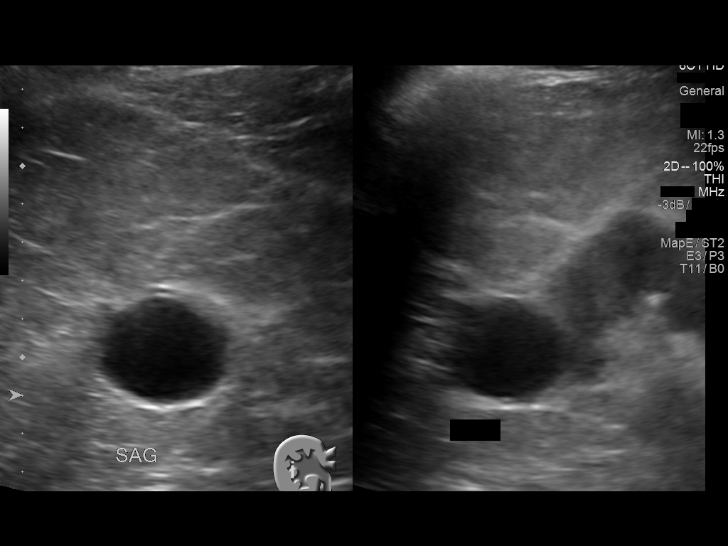
[im 16/42]
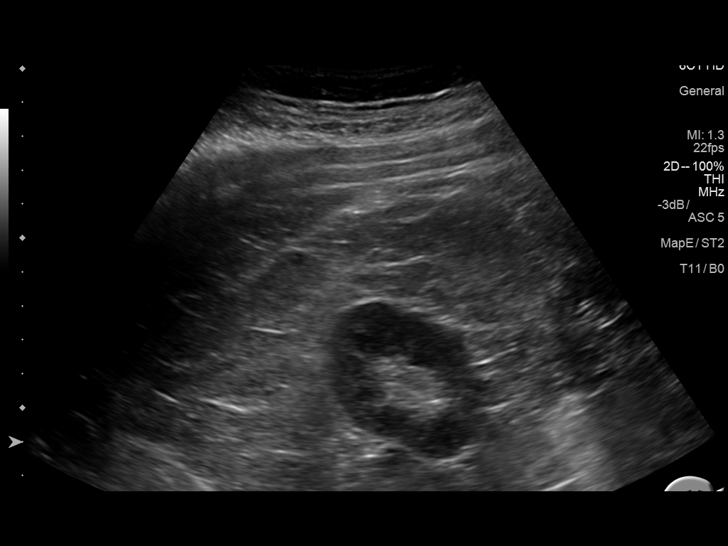
[im 19/42]
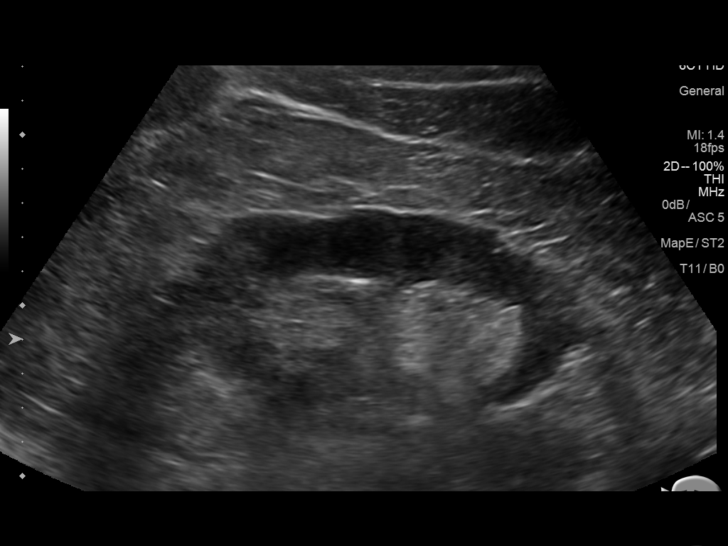
[im 23/42]
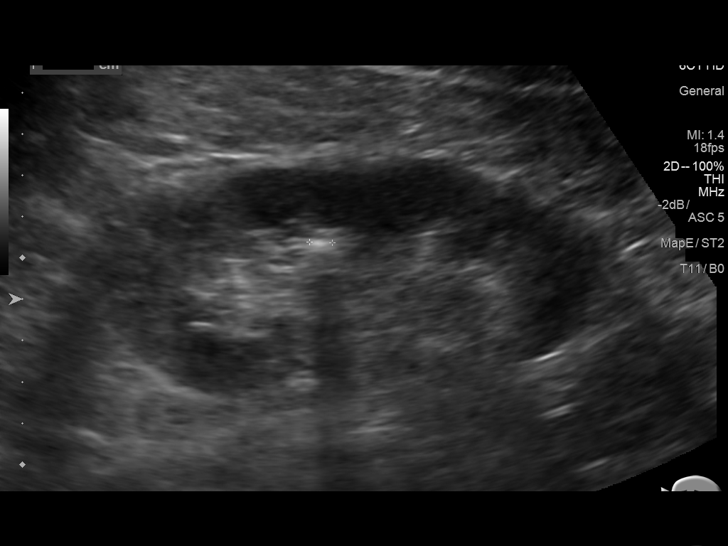
[im 26/42]
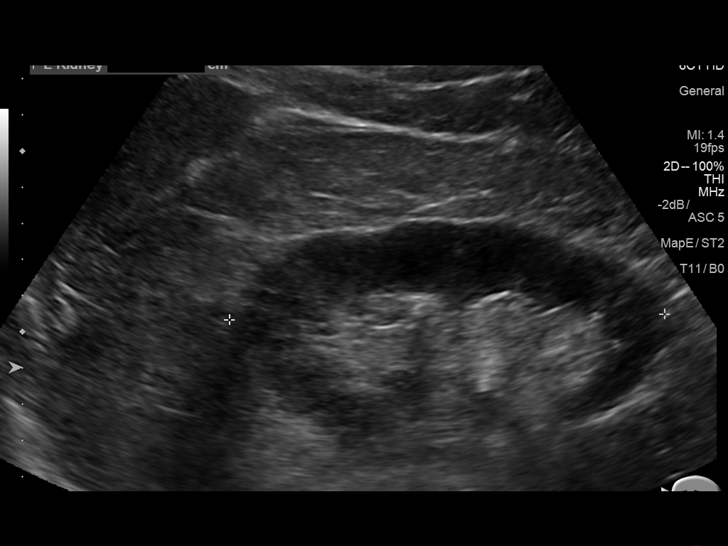
[im 28/42]
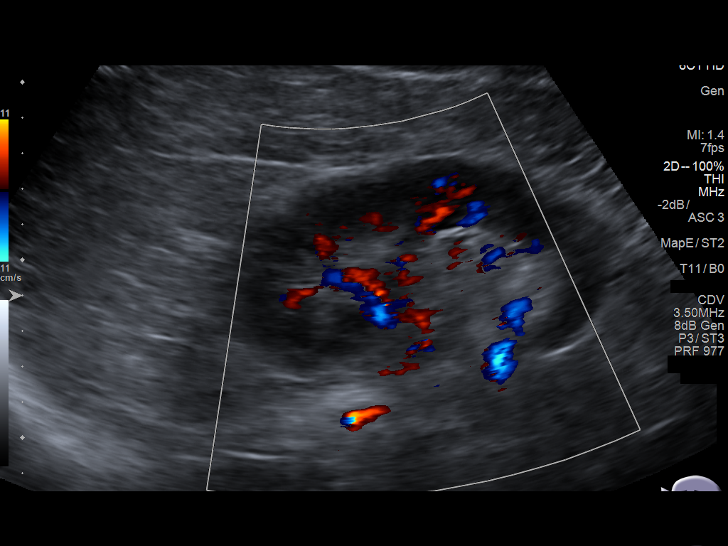
[im 31/42]
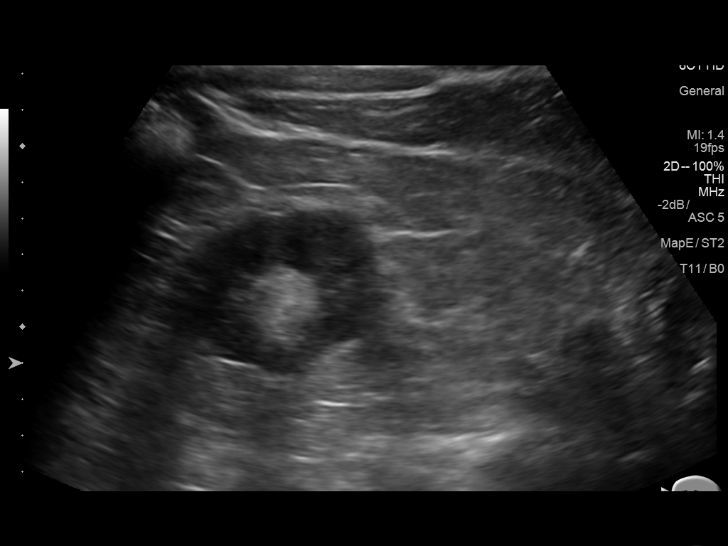
[im 35/42]
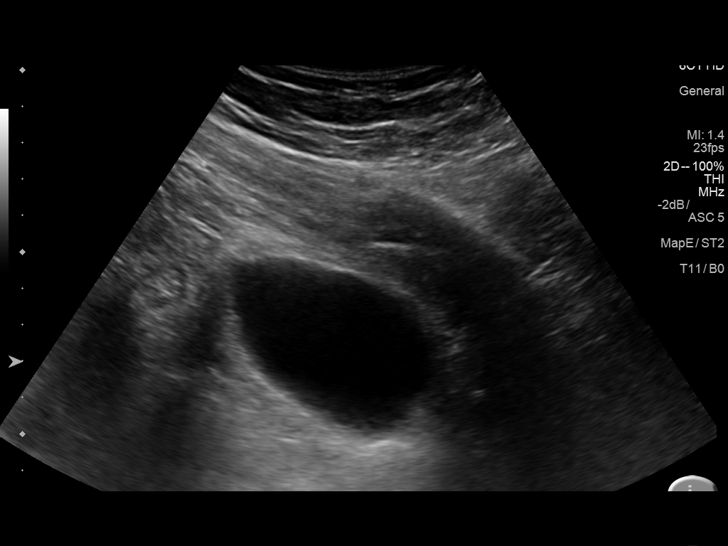
[im 38/42]
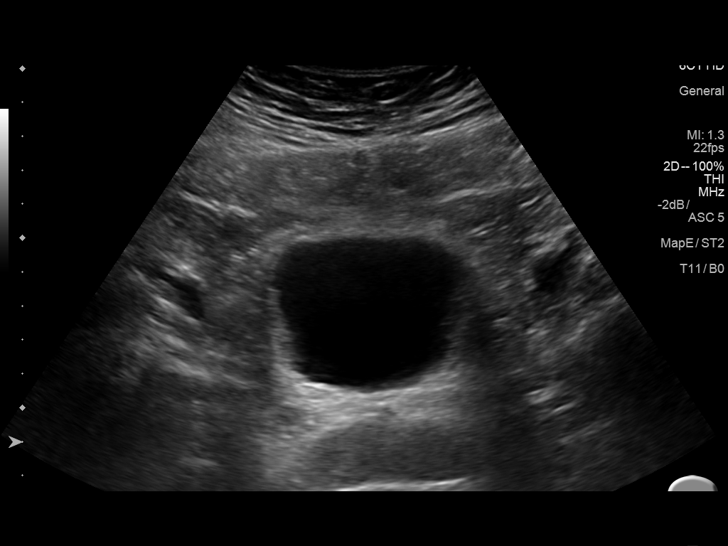
[im 42/42]
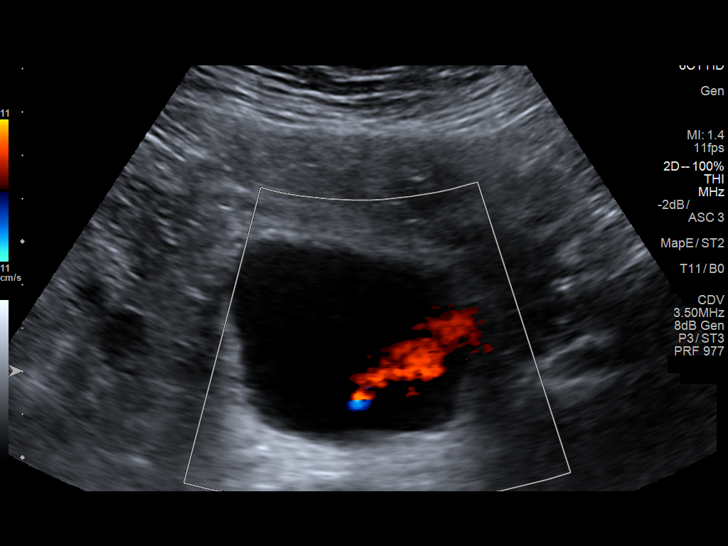

[14 of 25 positions shown; findings below may reference images not displayed]

FINDINGS: Right Kidney:

Length: 12.5 cm. Echogenicity within normal limits. No
hydronephrosis visualized 3.5 cm simple cyst. 2.6 cm cyst with thin
septation consistent benign cyst.

Left Kidney:

Length: 12.0 cm. Echogenicity within normal limits. No mass or
hydronephrosis visualized. 7 mm nonobstructing left renal stone.

Bladder:

Appears normal for degree of bladder distention.
IMPRESSION: 1.  Benign-appearing cysts right kidney as above.

2. 7 mm nonobstructing left renal stone. No acute abnormality. No
hydronephrosis or bladder distention.

## 2019-07-27 MED FILL — OLMESARTAN MEDOXOMIL 40 MG: 40 | 30 days supply | Qty: 30 | Fill #2

## 2019-08-24 ENCOUNTER — Other Ambulatory Visit: Payer: Self-pay

## 2019-08-24 ENCOUNTER — Ambulatory Visit (INDEPENDENT_AMBULATORY_CARE_PROVIDER_SITE_OTHER): Payer: No Typology Code available for payment source | Admitting: Osteopathic Medicine

## 2019-08-24 ENCOUNTER — Encounter: Payer: Self-pay | Admitting: Osteopathic Medicine

## 2019-08-24 VITALS — BP 147/89 | HR 65 | Temp 97.8°F | Wt 249.0 lb

## 2019-08-24 DIAGNOSIS — I10 Essential (primary) hypertension: Secondary | ICD-10-CM

## 2019-08-24 DIAGNOSIS — Z0000 Encounter for general adult medical examination without abnormal findings: Secondary | ICD-10-CM

## 2019-08-24 DIAGNOSIS — K7581 Nonalcoholic steatohepatitis (NASH): Secondary | ICD-10-CM

## 2019-08-24 DIAGNOSIS — R400 Somnolence: Secondary | ICD-10-CM

## 2019-08-24 DIAGNOSIS — K635 Polyp of colon: Secondary | ICD-10-CM | POA: Diagnosis not present

## 2019-08-24 DIAGNOSIS — M109 Gout, unspecified: Secondary | ICD-10-CM

## 2019-08-24 DIAGNOSIS — Z23 Encounter for immunization: Secondary | ICD-10-CM

## 2019-08-24 DIAGNOSIS — R7309 Other abnormal glucose: Secondary | ICD-10-CM

## 2019-08-24 DIAGNOSIS — R0683 Snoring: Secondary | ICD-10-CM

## 2019-08-24 MED ORDER — OLMESARTAN MEDOXOMIL 40 MG PO TABS: 40.0000 mg | ORAL_TABLET | Freq: Every day | ORAL | 3 refills | Status: AC

## 2019-08-24 MED ORDER — BYSTOLIC 20 MG PO TABS: 0.5000 | ORAL_TABLET | Freq: Every day | ORAL | 3 refills | Status: DC

## 2019-08-24 MED ORDER — AMLODIPINE BESYLATE 5 MG PO TABS: 5.0000 mg | ORAL_TABLET | Freq: Every day | ORAL | 3 refills | Status: AC

## 2019-08-24 MED ORDER — MONTELUKAST SODIUM 10 MG PO TABS: 10.0000 mg | ORAL_TABLET | Freq: Every day | ORAL | 3 refills | Status: DC

## 2019-08-24 MED ORDER — ALLOPURINOL 300 MG PO TABS: 300.0000 mg | ORAL_TABLET | Freq: Every day | ORAL | 3 refills | Status: AC

## 2019-08-24 MED ORDER — MINOCYCLINE HCL 100 MG PO CAPS: 100.0000 mg | ORAL_CAPSULE | Freq: Two times a day (BID) | ORAL | 1 refills | Status: AC

## 2019-08-24 NOTE — Progress Notes (Signed)
HPI: Robert Hoffman is a 58 y.o. male who  has a past medical history of Colon polyps, Gout, Hypertension, Kidney stone, Steatohepatitis, non-alcoholic, and Tachycardia.  [facility], 08/24/19,  for chief complaint of: Annual physical  Labs Refills     Patient here for annual physical / wellness exam.  See preventive care reviewed as below.  Recent labs reviewed in detail with the patient.   Additional concerns today include:  Sleep issues, snoring.    STOP-BANG for SLEEP APNEA Do you Snore loudly? Yes Do you often feel Tired during day? Yes Has anyone Observed you stop breathing? Yes History of high blood Pressure? Yes BMI >35? Yes Age >50? Yes Neck circumference >16 in? Yes Gender male? Yes 5-8 = high risk     Past medical, surgical, social and family history reviewed:  Patient Active Problem List   Diagnosis Date Noted  . Elevated blood pressure reading in office with diagnosis of hypertension 07/04/2018  . History of tachycardia 06/10/2018  . Renal cyst 06/10/2018  . NASH (nonalcoholic steatohepatitis) 06/10/2018  . Seasonal allergies 06/10/2018  . S/P cholecystectomy 12/08/2015  . Elevated liver function tests 09/05/2015  . Colon polyp 01/28/2015  . Male erectile disorder 06/22/2014  . Rosacea 09/04/2012  . Diverticulosis of colon 09/04/2012  . Essential hypertension 09/04/2012  . Gout 09/04/2012  . History of nephrolithiasis 09/04/2012  . History of PAT (paroxysmal atrial tachycardia) 09/04/2012    Past Surgical History:  Procedure Laterality Date  . CHOLECYSTECTOMY    . EXTRACORPOREAL SHOCK WAVE LITHOTRIPSY Left 07/31/2018   Procedure: LEFT EXTRACORPOREAL SHOCK WAVE LITHOTRIPSY (ESWL);  Surgeon: Crist FatHerrick, Benjamin W, MD;  Location: [facility];  Service: Urology;  Laterality: Left;  . REFRACTIVE SURGERY    . VASECTOMY    . WISDOM TOOTH EXTRACTION      Social History   Tobacco Use  . Smoking  status: Never Smoker  . Smokeless tobacco: Former Engineer, waterUser  Substance Use Topics  . Alcohol use: Not on file    No family history on file.   Current medication list and allergy/intolerance information reviewed:    Current Outpatient Medications  Medication Sig Dispense Refill  . allopurinol (ZYLOPRIM) 300 MG tablet Take 1 tablet (300 mg total) by mouth daily. 90 tablet 3  . amLODipine (NORVASC) 5 MG tablet Take 1 tablet (5 mg total) by mouth daily. 90 tablet 3  . budesonide (RHINOCORT AQUA) 32 MCG/ACT nasal spray Place into the nose.    Marland Kitchen. BYSTOLIC 20 MG TABS Take 0.5 tablets (10 mg total) by mouth daily. 45 tablet 3  . ipratropium (ATROVENT) 0.06 % nasal spray Place 2 sprays into both nostrils 4 (four) times daily. 15 mL 0  . levocetirizine (XYZAL) 5 MG tablet Take by mouth.    . minocycline (MINOCIN) 100 MG capsule Take 1 capsule (100 mg total) by mouth 2 (two) times daily. 180 capsule 1  . montelukast (SINGULAIR) 10 MG tablet Take 1 tablet (10 mg total) by mouth daily. 90 tablet 3  . olmesartan (BENICAR) 40 MG tablet Take 1 tablet (40 mg total) by mouth daily. 90 tablet 3  . sildenafil (VIAGRA) 100 MG tablet 1/2 to 1 tab as directed as needed for erectile dysfunction     No current facility-administered medications for this visit.     Allergies  Allergen Reactions  . Ace Inhibitors Cough      Review of Systems:  Constitutional:  No  fever, no  chills, No recent illness, No unintentional weight changes. +significant fatigue.   HEENT: No  headache, no vision change, no hearing change, No sore throat, No  sinus pressure  Cardiac: No  chest pain, No  pressure, No palpitations, No  Orthopnea  Respiratory:  No  shortness of breath. No  Cough  Gastrointestinal: No  abdominal pain, No  nausea, No  vomiting,  No  blood in stool, No  diarrhea, No  constipation   Musculoskeletal: No new myalgia/arthralgia  Skin: No  Rash, No other wounds/concerning lesions  Genitourinary: No   incontinence, No  abnormal genital bleeding, No abnormal genital discharge  Hem/Onc: No  easy bruising/bleeding, No  abnormal lymph node  Endocrine: No cold intolerance,  No heat intolerance. No polyuria/polydipsia/polyphagia   Neurologic: No  weakness, No  dizziness, No  slurred speech/focal weakness/facial droop  Psychiatric: No  concerns with depression, No  concerns with anxiety, +sleep problems, No mood problems  Exam:  BP (!) 147/89   Pulse 65   Temp 97.8 F (36.6 C) (Oral)   Wt 249 lb (112.9 kg)   BMI 35.73 kg/m   Constitutional: VS see above. General Appearance: alert, well-developed, well-nourished, NAD  Eyes: Normal lids and conjunctive, non-icteric sclera  Neck: No masses, trachea midline. No thyroid enlargement. No tenderness/mass appreciated. No lymphadenopathy  Respiratory: Normal respiratory effort. no wheeze, no rhonchi, no rales  Cardiovascular: S1/S2 normal, no murmur, no rub/gallop auscultated. RRR. No lower extremity edema.  Gastrointestinal: Nontender, no masses. No hepatomegaly, no splenomegaly. No hernia appreciated. Bowel sounds normal. Rectal exam deferred.   Musculoskeletal: Gait normal. No clubbing/cyanosis of digits.   Neurological: Normal balance/coordination. No tremor. No cranial nerve deficit on limited exam. Motor and sensation intact and symmetric. Cerebellar reflexes intact.   Skin: warm, dry, intact. No rash/ulcer. No concerning nevi or subq nodules on limited exam.    Psychiatric: Normal judgment/insight. Normal mood and affect. Oriented x3.    No results found for this or any previous visit (from the past 72 hour(s)).  No results found.       ASSESSMENT/PLAN: The primary encounter diagnosis was Annual physical exam. Diagnoses of Essential hypertension, Polyp of colon, unspecified part of colon, unspecified type, Elevated blood pressure reading in office with diagnosis of hypertension, NASH (nonalcoholic steatohepatitis), Gout,  unspecified cause, unspecified chronicity, unspecified site, Elevated hemoglobin A1c measurement, Need for influenza vaccination, Daytime somnolence, and Snoring were also pertinent to this visit.   Orders Placed This Encounter  Procedures  . Flu Vaccine QUAD High Dose(Fluad)  . CBC  . COMPLETE METABOLIC PANEL WITH GFR  . Lipid panel  . TSH  . PSA, Total with Reflex to PSA, Free  . Uric acid  . Hemoglobin A1c  . Home sleep test    No orders of the defined types were placed in this encounter.   Patient Instructions  General Preventive Care  Most recent routine screening lipids/other labs: ordered today   BP: goal 130/80 or under   Tobacco: don't!   Alcohol: responsible moderation is ok for most adults - if you have concerns about your alcohol intake, please talk to me!   Exercise: as tolerated to reduce risk of cardiovascular disease and diabetes. Strength training will also prevent osteoporosis.   Mental health: if need for mental health care (medicines, counseling, other), or concerns about moods, please let me know!   Sexual health: if need for STD testing, or if concerns with libido/pain problems, please let me know!   Advanced Directive:  Living Will and/or Healthcare Power of Attorney recommended for all adults, regardless of age or health.  Vaccines  Flu vaccine: recommended for almost everyone, every fall.   Shingles vaccine: Shingrix recommended after age 47. All done!   Pneumonia vaccines: Prevnar and Pneumovax recommended after age 82, or sooner if certain medical conditions/smokers  Tetanus booster: Tdap recommended every 10 years.  Cancer screenings   Colon cancer screening: recommended for everyone at age 13-75  Prostate cancer screening: PSA blood test age 79-71  Lung cancer screening: not needed for non-smokers  Infection screenings . HIV: recommended screening at least once age 61-65, more often as needed. . Gonorrhea/Chlamydia: screening as  needed. . Hepatitis C: recommended once for anyone born 68-1967 . TB: certain at-risk populations, or depending on work requirements and/or travel history Other . Bone Density Test: recommended for men at age 9 . Abdominal Aortic Aneurysm: screening not needed for non-smokers         Visit summary with medication list and pertinent instructions was printed for patient to review. All questions at time of visit were answered - patient instructed to contact office with any additional concerns or updates. ER/RTC precautions were reviewed with the patient.     Please note: voice recognition software was used to produce this document, and typos may escape review. Please contact Dr. Lyn Hollingshead for any needed clarifications.     Follow-up plan: Return for RECHECK PENDING LAB RESULTS AND BLOOD PRESSURES! Marland Kitchen

## 2019-08-24 NOTE — Patient Instructions (Addendum)
General Preventive Care  Most recent routine screening lipids/other labs: ordered today   BP: goal 130/80 or under   Tobacco: don't!   Alcohol: responsible moderation is ok for most adults - if you have concerns about your alcohol intake, please talk to me!   Exercise: as tolerated to reduce risk of cardiovascular disease and diabetes. Strength training will also prevent osteoporosis.   Mental health: if need for mental health care (medicines, counseling, other), or concerns about moods, please let me know!   Sexual health: if need for STD testing, or if concerns with libido/pain problems, please let me know!   Advanced Directive: Living Will and/or Healthcare Power of Attorney recommended for all adults, regardless of age or health.  Vaccines  Flu vaccine: recommended for almost everyone, every fall.   Shingles vaccine: Shingrix recommended after age 5. All done!   Pneumonia vaccines: Prevnar and Pneumovax recommended after age 95, or sooner if certain medical conditions/smokers  Tetanus booster: Tdap recommended every 10 years.  Cancer screenings   Colon cancer screening: recommended for everyone at age 51-75  Prostate cancer screening: PSA blood test age 76-71  Lung cancer screening: not needed for non-smokers  Infection screenings . HIV: recommended screening at least once age 23-65, more often as needed. . Gonorrhea/Chlamydia: screening as needed. . Hepatitis C: recommended once for anyone born 75-1967 . TB: certain at-risk populations, or depending on work requirements and/or travel history Other . Bone Density Test: recommended for men at age 44 . Abdominal Aortic Aneurysm: screening not needed for non-smokers

## 2019-08-25 ENCOUNTER — Encounter: Payer: Self-pay | Admitting: Osteopathic Medicine

## 2019-08-25 DIAGNOSIS — Z23 Encounter for immunization: Secondary | ICD-10-CM | POA: Diagnosis not present

## 2019-08-25 LAB — LIPID PANEL
Cholesterol: 182 mg/dL (ref <200)
HDL: 42 mg/dL (ref >=40)
LDL Cholesterol (Calc): 113 mg/dL (calc) — ABNORMAL HIGH
Non-HDL Cholesterol (Calc): 140 mg/dL (calc) — ABNORMAL HIGH (ref <130)
Total CHOL/HDL Ratio: 4.3 (calc) (ref <5.0)
Triglycerides: 156 mg/dL — ABNORMAL HIGH (ref <150)

## 2019-08-25 LAB — CBC
HCT: 49 % (ref 38.5–50.0)
Hemoglobin: 16.4 g/dL (ref 13.2–17.1)
MCH: 28.6 pg (ref 27.0–33.0)
MCHC: 33.5 g/dL (ref 32.0–36.0)
MCV: 85.4 fL (ref 80.0–100.0)
MPV: 10.6 fL (ref 7.5–12.5)
Platelets: 197 10*3/uL (ref 140–400)
RBC: 5.74 10*6/uL (ref 4.20–5.80)
RDW: 14.3 % (ref 11.0–15.0)
WBC: 7.2 10*3/uL (ref 3.8–10.8)

## 2019-08-25 LAB — URIC ACID: Uric Acid, Serum: 5.3 mg/dL (ref 4.0–8.0)

## 2019-08-25 LAB — COMPLETE METABOLIC PANEL WITH GFR
AG Ratio: 2 (calc) (ref 1.0–2.5)
ALT: 49 U/L — ABNORMAL HIGH (ref 9–46)
AST: 39 U/L — ABNORMAL HIGH (ref 10–35)
Albumin: 4.5 g/dL (ref 3.6–5.1)
Alkaline phosphatase (APISO): 54 U/L (ref 35–144)
BUN: 16 mg/dL (ref 7–25)
CO2: 26 mmol/L (ref 20–32)
Calcium: 9.3 mg/dL (ref 8.6–10.3)
Chloride: 105 mmol/L (ref 98–110)
Creat: 1.14 mg/dL (ref 0.70–1.33)
GFR, Est African American: 81 mL/min/{1.73_m2} (ref >=60)
GFR, Est Non African American: 70 mL/min/{1.73_m2} (ref >=60)
Globulin: 2.2 g/dL (calc) (ref 1.9–3.7)
Glucose, Bld: 95 mg/dL (ref 65–99)
Potassium: 4.2 mmol/L (ref 3.5–5.3)
Sodium: 143 mmol/L (ref 135–146)
Total Bilirubin: 0.5 mg/dL (ref 0.2–1.2)
Total Protein: 6.7 g/dL (ref 6.1–8.1)

## 2019-08-25 LAB — HEMOGLOBIN A1C
Hgb A1c MFr Bld: 5.6 % of total Hgb (ref <5.7)
Mean Plasma Glucose: 114 (calc)
eAG (mmol/L): 6.3 (calc)

## 2019-08-25 LAB — TSH: TSH: 1.54 mIU/L (ref 0.40–4.50)

## 2019-08-25 LAB — PSA, TOTAL WITH REFLEX TO PSA, FREE: PSA, Total: 1.6 ng/mL (ref <=4.0)

## 2019-08-26 MED FILL — MINOCYCLINE HCL 100 MG CAPS: 100 | 90 days supply | Qty: 180 | Fill #0

## 2019-08-26 MED FILL — AMLODIPINE BESYLATE 5 MG TA: 5 | 90 days supply | Qty: 90 | Fill #0

## 2019-08-26 MED FILL — BYSTOLIC 20 MG TABLET: 20 | 90 days supply | Qty: 45 | Fill #0

## 2019-08-26 MED FILL — OLMESARTAN MEDOXOMIL 40 MG: 40 | 90 days supply | Qty: 90 | Fill #0

## 2019-08-26 NOTE — Telephone Encounter (Signed)
Clinical notes faxed to [facility] for medical review prior to approval.  Fax: [phone]

## 2019-08-27 ENCOUNTER — Other Ambulatory Visit: Payer: Self-pay | Admitting: Osteopathic Medicine

## 2019-08-27 ENCOUNTER — Encounter: Payer: Self-pay | Admitting: Osteopathic Medicine

## 2019-08-27 MED ORDER — SILDENAFIL CITRATE 100 MG PO TABS: ORAL_TABLET | ORAL | 11 refills | Status: DC

## 2019-08-27 MED ORDER — LEVOCETIRIZINE DIHYDROCHLORIDE 5 MG PO TABS: 5.0000 mg | ORAL_TABLET | Freq: Every day | ORAL | 3 refills | Status: AC

## 2019-08-27 MED FILL — SILDENAFIL CITRATE 100 MG T: 100 | 30 days supply | Qty: 6 | Fill #0

## 2019-08-27 MED FILL — LEVOCETIRIZINE 5 MG TABLET: 5 | 90 days supply | Qty: 90 | Fill #0

## 2019-09-29 MED FILL — ALLOPURINOL 300 MG TABS: 300 | 90 days supply | Qty: 90 | Fill #0

## 2019-10-28 ENCOUNTER — Other Ambulatory Visit: Payer: Self-pay

## 2019-10-28 ENCOUNTER — Ambulatory Visit (HOSPITAL_BASED_OUTPATIENT_CLINIC_OR_DEPARTMENT_OTHER): Payer: No Typology Code available for payment source | Attending: Osteopathic Medicine | Admitting: Internal Medicine

## 2019-10-28 DIAGNOSIS — R0683 Snoring: Secondary | ICD-10-CM | POA: Diagnosis present

## 2019-10-28 DIAGNOSIS — G4733 Obstructive sleep apnea (adult) (pediatric): Secondary | ICD-10-CM

## 2019-10-28 DIAGNOSIS — R400 Somnolence: Secondary | ICD-10-CM | POA: Diagnosis not present

## 2019-10-28 DIAGNOSIS — I10 Essential (primary) hypertension: Secondary | ICD-10-CM | POA: Insufficient documentation

## 2019-11-06 DIAGNOSIS — G4733 Obstructive sleep apnea (adult) (pediatric): Secondary | ICD-10-CM | POA: Insufficient documentation

## 2019-11-11 ENCOUNTER — Encounter: Payer: Self-pay | Admitting: Osteopathic Medicine

## 2019-11-13 DIAGNOSIS — G4733 Obstructive sleep apnea (adult) (pediatric): Secondary | ICD-10-CM

## 2019-11-13 NOTE — Procedures (Signed)
    Patient Name: Robert Hoffman, Robert Hoffman Date: 10/31/2019 Gender: Male D.O.B: 04-06-1961 Age (years): 58 Referring Provider: Sunnie Nielsen Height (inches): 71 Interpreting Physician: Jetty Duhamel MD, ABSM Weight (lbs): 240 RPSGT: [place] BMI: 33 MRN: [id number] Neck Size: 18.50  CLINICAL INFORMATION Sleep Study Type: HST Indication for sleep study: Excessive Daytime Sleepiness, Snoring (786.09) Epworth Sleepiness Score: 8  SLEEP STUDY TECHNIQUE A multi-channel overnight portable sleep study was performed. The channels recorded were: nasal airflow, thoracic respiratory movement, and oxygen saturation with a pulse oximetry. Snoring was also monitored.  MEDICATIONS Patient self administered medications include: none reported.  SLEEP ARCHITECTURE Patient was studied for 345.6 minutes. The sleep efficiency was 99.1 % and the patient was supine for 99.9%. The arousal index was 0.0 per hour.  RESPIRATORY PARAMETERS The overall AHI was 71.2 per hour, with a central apnea index of 0.0 per hour. The oxygen nadir was 83% during sleep.  CARDIAC DATA Mean heart rate during sleep was 53.0 bpm.  IMPRESSIONS - Severe obstructive sleep apnea occurred during this study (AHI = 71.2/h). - No significant central sleep apnea occurred during this study (CAI = 0.0/h). - Moderate oxygen desaturation was noted during this study (Min O2 = 83%). - Patient snored.  DIAGNOSIS - Obstructive Sleep Apnea (327.23 [G47.33 ICD-10])  RECOMMENDATIONS - Suggest CPAP titration sleep study or autopap. Other options would be based on clinical judgment. - Be careful with alcohol, sedatives and other CNS depressants that may worsen sleep apnea and disrupt normal sleep architecture. - Sleep hygiene should be reviewed to assess factors that may improve sleep quality. - Weight management and regular exercise should be initiated or continued.  [Electronically signed] 11/13/2019 11:03 AM  Jetty Duhamel MD, ABSM Diplomate, American Board of Sleep Medicine   NPI: [id number]                         Jetty Duhamel Diplomate, American Board of Sleep Medicine  ELECTRONICALLY SIGNED ON:  11/13/2019, 11:05 AM [facility] PH: [phone]   FX: [phone] ACCREDITED BY THE AMERICAN ACADEMY OF SLEEP MEDICINE

## 2019-11-16 ENCOUNTER — Other Ambulatory Visit: Payer: Self-pay | Admitting: Osteopathic Medicine

## 2019-11-16 MED ORDER — AMBULATORY NON FORMULARY MEDICATION: 99 refills | Status: AC

## 2019-11-23 MED FILL — AMLODIPINE BESYLATE 5 MG TA: 5 | 90 days supply | Qty: 90 | Fill #1

## 2019-11-23 MED FILL — OLMESARTAN MEDOXOMIL 40 MG: 40 | 90 days supply | Qty: 90 | Fill #1

## 2019-12-22 MED FILL — ALLOPURINOL 300 MG TABS: 300 | 90 days supply | Qty: 90 | Fill #1

## 2019-12-22 MED FILL — BYSTOLIC 20 MG TABLET: 20 | 90 days supply | Qty: 45 | Fill #1

## 2020-02-22 MED FILL — OLMESARTAN MEDOXOMIL 40 MG: 40 | 90 days supply | Qty: 90 | Fill #2

## 2020-02-22 MED FILL — AMLODIPINE BESYLATE 5 MG TA: 5 | 90 days supply | Qty: 90 | Fill #2

## 2020-02-23 ENCOUNTER — Other Ambulatory Visit: Payer: Self-pay

## 2020-02-23 MED ORDER — MONTELUKAST SODIUM 10 MG PO TABS: 10.0000 mg | ORAL_TABLET | Freq: Every day | ORAL | 1 refills | Status: DC

## 2020-02-29 ENCOUNTER — Encounter: Payer: Self-pay | Admitting: Osteopathic Medicine

## 2020-03-01 MED ORDER — SILDENAFIL CITRATE 100 MG PO TABS: ORAL_TABLET | ORAL | 11 refills | Status: AC

## 2020-03-01 MED ORDER — MONTELUKAST SODIUM 10 MG PO TABS: 10.0000 mg | ORAL_TABLET | Freq: Every day | ORAL | 1 refills | Status: AC

## 2020-03-01 MED ORDER — MONTELUKAST SODIUM 10 MG PO TABS: 10.0000 mg | ORAL_TABLET | Freq: Every day | ORAL | 1 refills | Status: DC

## 2020-03-01 MED FILL — SILDENAFIL CITRATE 100 MG T: 100 | 10 days supply | Qty: 10 | Fill #0

## 2020-03-01 MED FILL — MONTELUKAST SOD 10 MG TAB: 10 | 90 days supply | Qty: 90 | Fill #0

## 2020-03-01 NOTE — Addendum Note (Signed)
Addended by: Jed Limerick on: 03/01/2020 10:52 AM   Modules accepted: Orders

## 2020-03-22 ENCOUNTER — Encounter: Payer: Self-pay | Admitting: Osteopathic Medicine

## 2020-03-25 ENCOUNTER — Encounter: Payer: Self-pay | Admitting: Osteopathic Medicine

## 2020-03-29 MED FILL — MINOCYCLINE HCL 100 MG CAPS: 100 | 90 days supply | Qty: 180 | Fill #1

## 2020-03-29 MED FILL — ALLOPURINOL 300 MG TABS: 300 | 90 days supply | Qty: 90 | Fill #2

## 2020-05-18 MED FILL — OLMESARTAN MEDOXOMIL 40 MG: 40 | 30 days supply | Qty: 30 | Fill #3

## 2020-05-18 MED FILL — AMLODIPINE BESYLATE 5 MG TA: 5 | 30 days supply | Qty: 30 | Fill #3

## 2020-05-19 ENCOUNTER — Encounter: Payer: Self-pay | Admitting: Osteopathic Medicine

## 2020-05-19 MED ORDER — ATENOLOL 50 MG PO TABS: 50.0000 mg | ORAL_TABLET | Freq: Every day | ORAL | 0 refills | Status: AC

## 2020-05-19 MED FILL — ATENOLOL 50 MG TABLET: 50 | 30 days supply | Qty: 30 | Fill #0

## 2020-05-28 IMAGING — CR DG ABDOMEN 1V
2 series · 2 of 2 positions shown · non-contrast
Comparison: 07/15/2018

CLINICAL DATA: Left renal stones for upcoming lithotripsy

EXAM:
ABDOMEN - 1 VIEW

[t abdomen supine (1 of 2)]
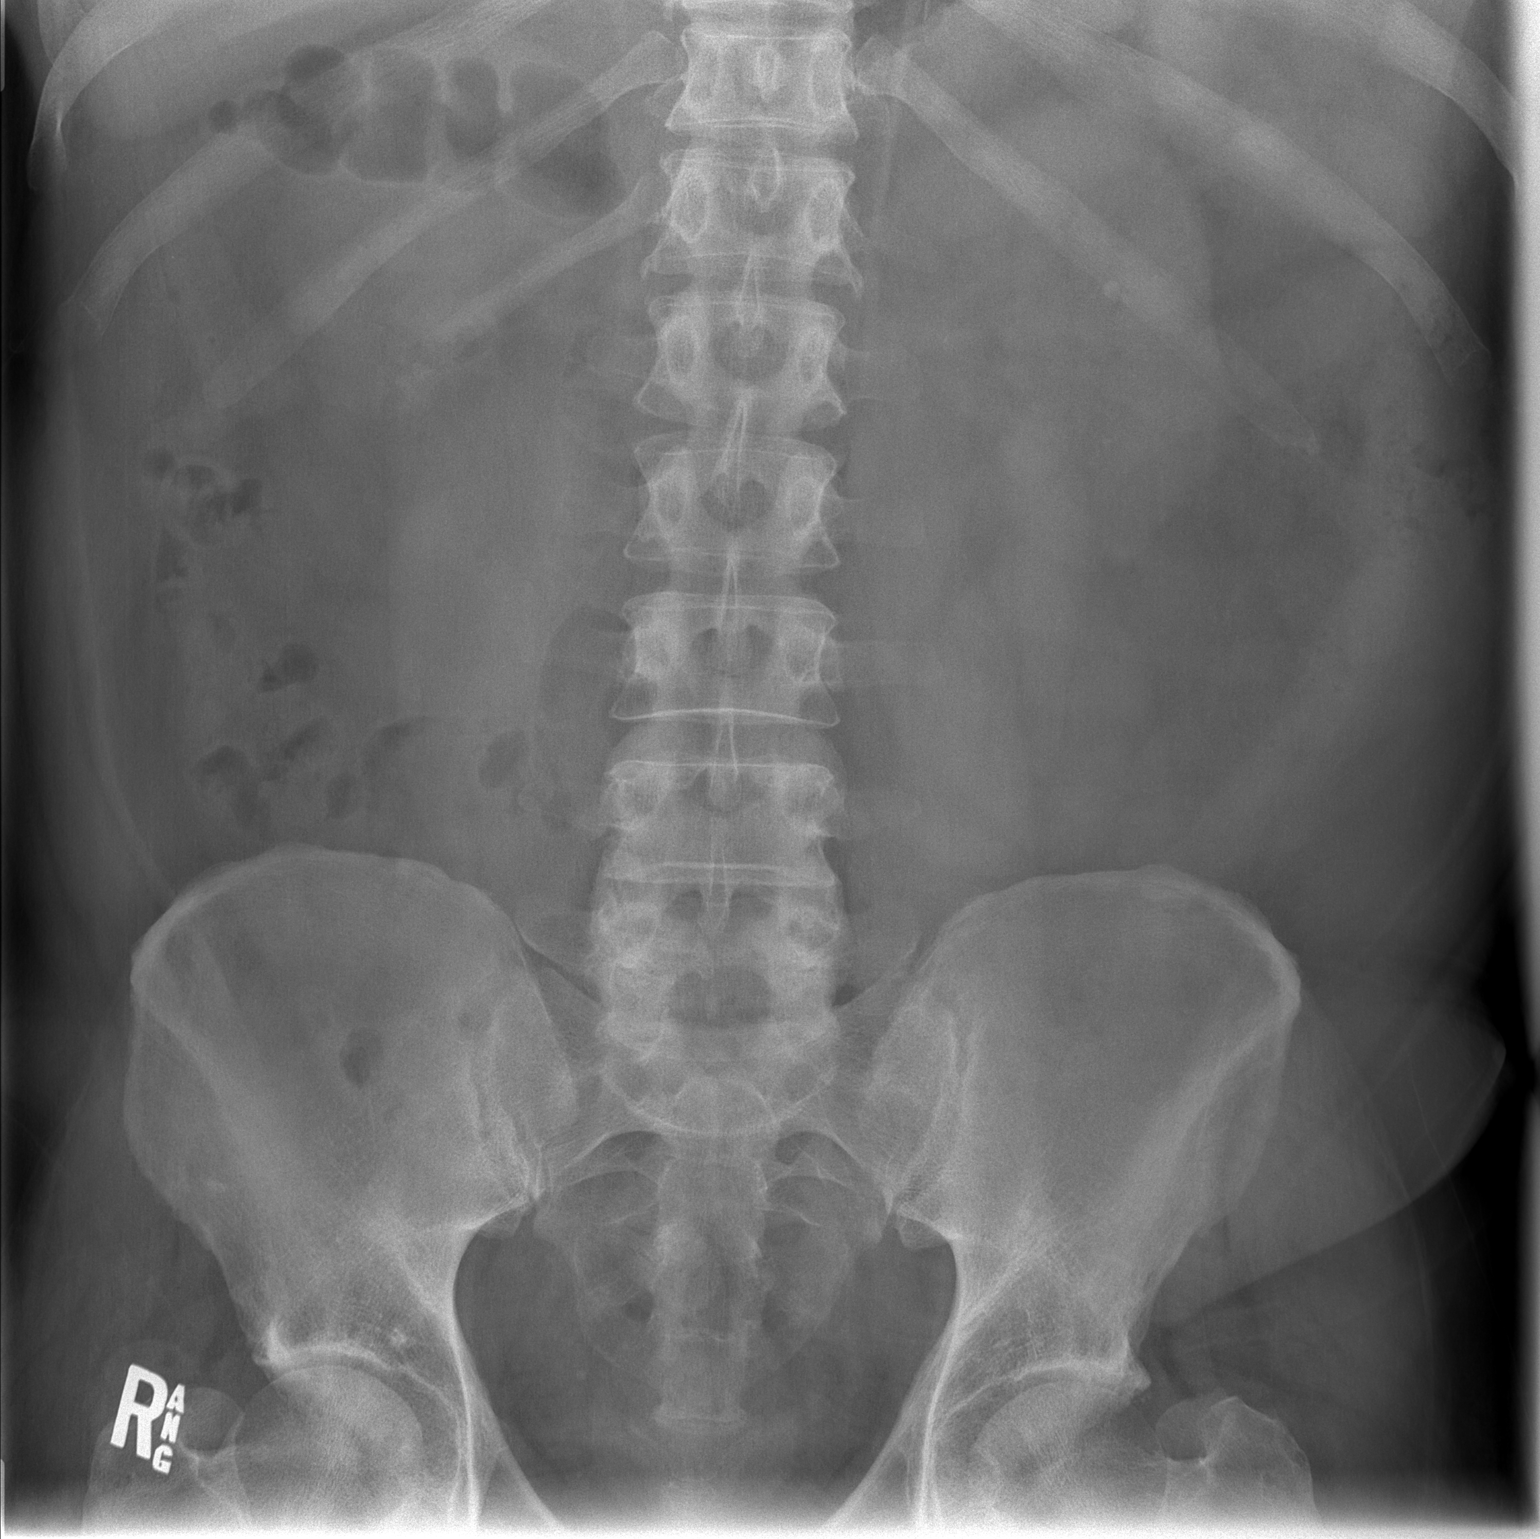

[t abdomen supine (2 of 2)]
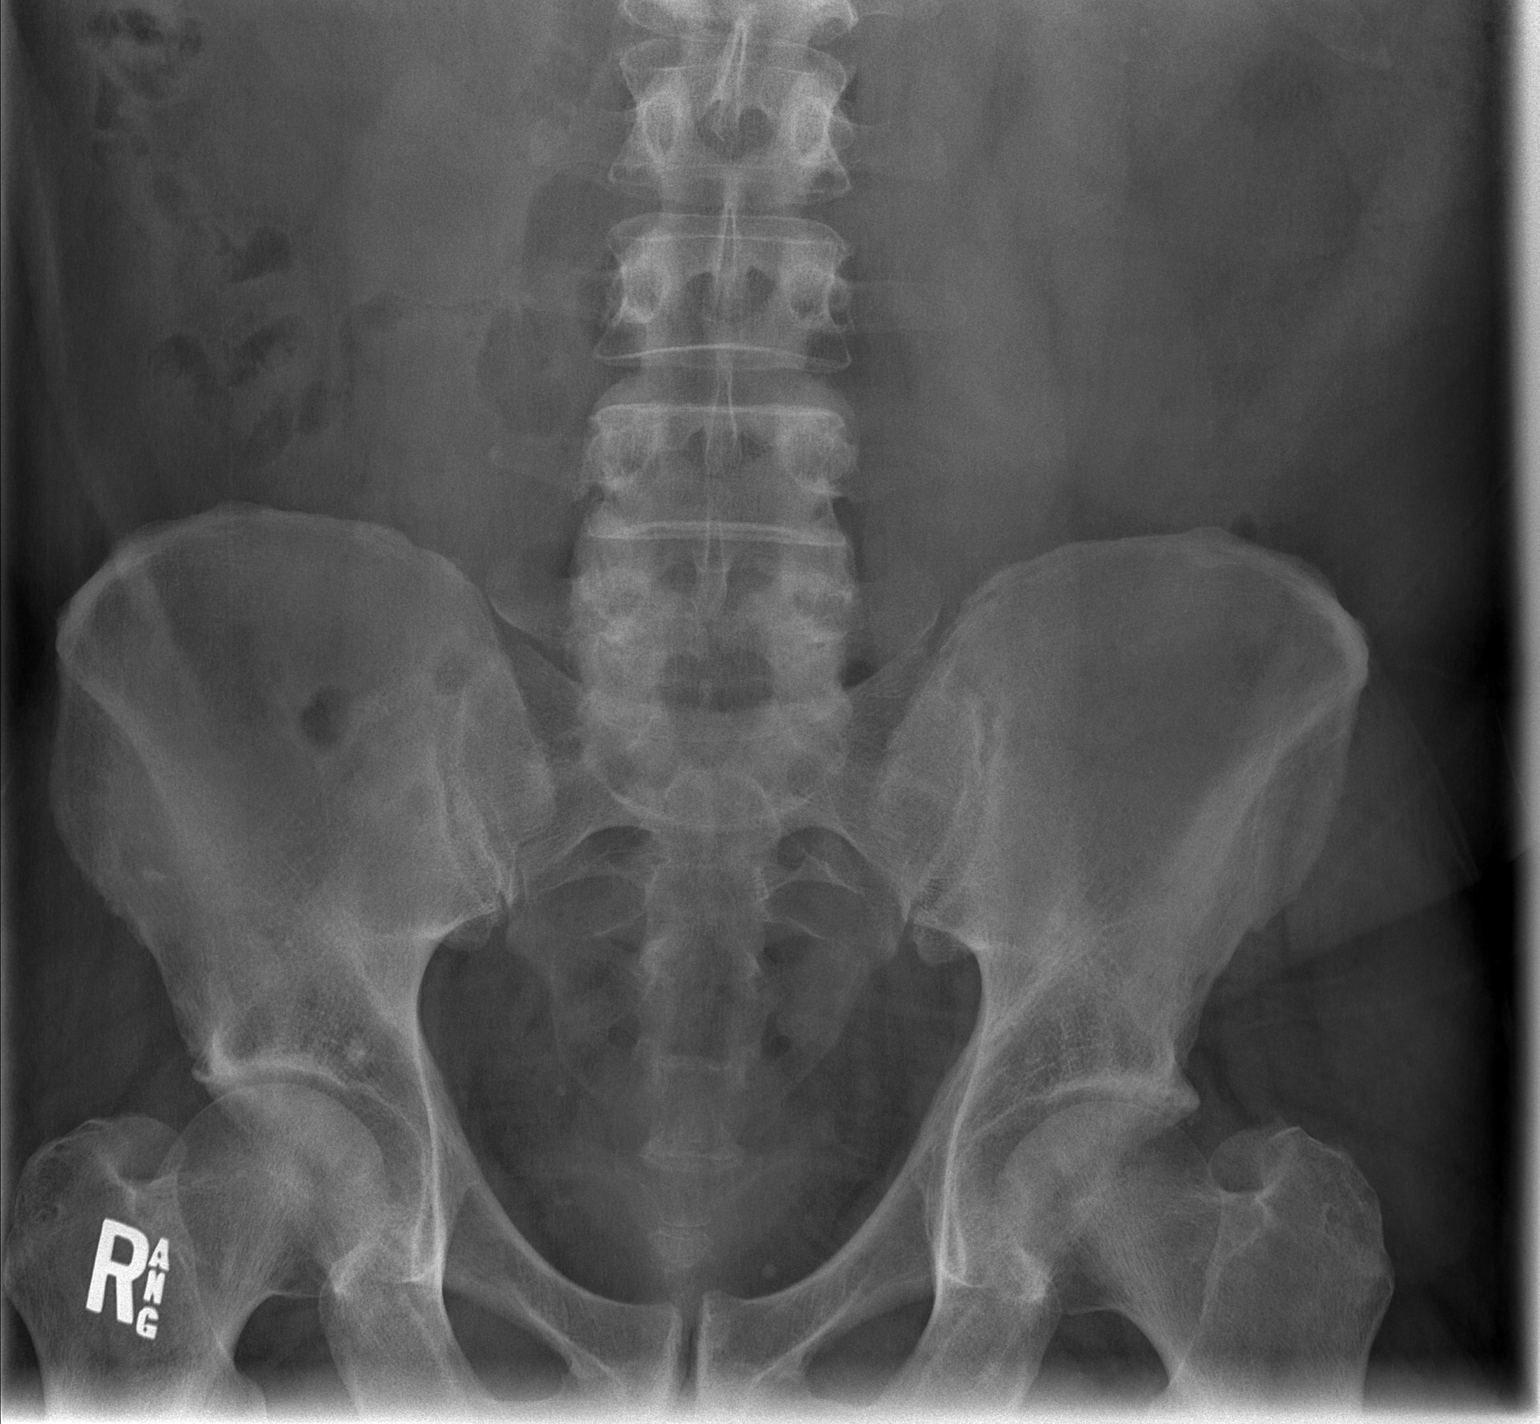

[2 of 2 positions shown; findings below may reference images not displayed]

FINDINGS: Scattered large and small bowel gas is noted. No abnormal mass
lesion is seen. Scattered small renal calculi are noted on the left
consistent with the given clinical history. The largest of these
measures approximately 6 mm. No ureteral stones are seen. No bony
abnormality is noted.
IMPRESSION: Stable left renal stones.

## 2020-06-14 MED FILL — AMLODIPINE BESYLATE 10 MG T: 10 | 30 days supply | Qty: 30 | Fill #0

## 2020-06-15 MED FILL — AMLODIPINE BESYLATE 5 MG TA: 5 | 30 days supply | Qty: 30 | Fill #4

## 2020-06-15 MED FILL — OLMESARTAN MEDOXOMIL 40 MG: 40 | 30 days supply | Qty: 30 | Fill #4

## 2020-06-15 MED FILL — ATENOLOL 50 MG TABLET: 50 | 30 days supply | Qty: 30 | Fill #1

## 2020-06-27 MED FILL — ALLOPURINOL 300 MG TABS: 300 | 30 days supply | Qty: 30 | Fill #3
